# Patient Record
Sex: Male | Born: 1945 | Race: White | Hispanic: No | Marital: Married | Smoking: Never smoker
Health system: Southern US, Community
[De-identification: ages and names within clinical notes are randomized; demographics above are authoritative.]

## PROBLEM LIST (undated history)

## (undated) DIAGNOSIS — E785 Hyperlipidemia, unspecified: Secondary | ICD-10-CM

## (undated) DIAGNOSIS — I10 Essential (primary) hypertension: Secondary | ICD-10-CM

## (undated) DIAGNOSIS — N181 Chronic kidney disease, stage 1: Secondary | ICD-10-CM

## (undated) DIAGNOSIS — M1990 Unspecified osteoarthritis, unspecified site: Secondary | ICD-10-CM

## (undated) DIAGNOSIS — E119 Type 2 diabetes mellitus without complications: Secondary | ICD-10-CM

---

## 2019-03-18 ENCOUNTER — Other Ambulatory Visit: Payer: Self-pay | Admitting: Nurse Practitioner

## 2019-03-18 DIAGNOSIS — U071 COVID-19: Secondary | ICD-10-CM

## 2019-03-18 DIAGNOSIS — E119 Type 2 diabetes mellitus without complications: Secondary | ICD-10-CM

## 2019-03-18 NOTE — Progress Notes (Signed)
  I connected by phone with Marc Young on 03/18/2019 at 11:31 AM to discuss the potential use of an new treatment for mild to moderate COVID-19 viral infection in non-hospitalized patients.  This patient is a 73 y.o. male that meets the FDA criteria for Emergency Use Authorization of bamlanivimab or casirivimab\imdevimab.  Has a (+) direct SARS-CoV-2 viral test result  Has mild or moderate COVID-19   Is ? 73 years of age and weighs ? 40 kg  Is NOT hospitalized due to COVID-19  Is NOT requiring oxygen therapy or requiring an increase in baseline oxygen flow rate due to COVID-19  Is within 10 days of symptom onset  Has at least one of the high risk factor(s) for progression to severe COVID-19 and/or hospitalization as defined in EUA.  Specific high risk criteria : Diabetes Patient is over the age of 17 and is currently being managed for diabetes.   I have spoken and communicated the following to the patient or parent/caregiver:  1. FDA has authorized the emergency use of bamlanivimab and casirivimab\imdevimab for the treatment of mild to moderate COVID-19 in adults and pediatric patients with positive results of direct SARS-CoV-2 viral testing who are 74 years of age and older weighing at least 40 kg, and who are at high risk for progressing to severe COVID-19 and/or hospitalization.  2. The significant known and potential risks and benefits of bamlanivimab and casirivimab\imdevimab, and the extent to which such potential risks and benefits are unknown.  3. Information on available alternative treatments and the risks and benefits of those alternatives, including clinical trials.  4. Patients treated with bamlanivimab and casirivimab\imdevimab should continue to self-isolate and use infection control measures (e.g., wear mask, isolate, social distance, avoid sharing personal items, clean and disinfect "high touch" surfaces, and frequent handwashing) according to CDC guidelines.    5. The patient or parent/caregiver has the option to accept or refuse bamlanivimab or casirivimab\imdevimab .  After reviewing this information with the patient, The patient agreed to proceed with receiving the bamlanimivab infusion and will be provided a copy of the Fact sheet prior to receiving the infusion.[name] 03/18/2019 11:31 AM  Note: patient will need a copy of recent positive COVID test scanned into chart before infusion.

## 2019-03-20 ENCOUNTER — Inpatient Hospital Stay (HOSPITAL_COMMUNITY)
Admission: EM | Admit: 2019-03-20 | Discharge: 2019-04-22 | DRG: 207 | Disposition: E | Payer: Medicare Other | Source: Ambulatory Visit | Attending: Pulmonary Disease | Admitting: Pulmonary Disease

## 2019-03-20 ENCOUNTER — Ambulatory Visit (HOSPITAL_COMMUNITY)
Admission: RE | Admit: 2019-03-20 | Discharge: 2019-03-20 | Disposition: A | Discharge status: Home / Self Care | Payer: Medicare Other | Source: Ambulatory Visit | Attending: Pulmonary Disease | Admitting: Pulmonary Disease

## 2019-03-20 ENCOUNTER — Other Ambulatory Visit: Payer: Self-pay

## 2019-03-20 ENCOUNTER — Emergency Department (HOSPITAL_COMMUNITY): Payer: Medicare Other

## 2019-03-20 ENCOUNTER — Encounter (HOSPITAL_COMMUNITY): Payer: Self-pay | Admitting: Internal Medicine

## 2019-03-20 DIAGNOSIS — G92 Toxic encephalopathy: Secondary | ICD-10-CM | POA: Diagnosis present

## 2019-03-20 DIAGNOSIS — Z515 Encounter for palliative care: Secondary | ICD-10-CM | POA: Diagnosis not present

## 2019-03-20 DIAGNOSIS — I959 Hypotension, unspecified: Secondary | ICD-10-CM | POA: Diagnosis not present

## 2019-03-20 DIAGNOSIS — E871 Hypo-osmolality and hyponatremia: Secondary | ICD-10-CM | POA: Diagnosis present

## 2019-03-20 DIAGNOSIS — D62 Acute posthemorrhagic anemia: Secondary | ICD-10-CM | POA: Diagnosis not present

## 2019-03-20 DIAGNOSIS — Z7984 Long term (current) use of oral hypoglycemic drugs: Secondary | ICD-10-CM

## 2019-03-20 DIAGNOSIS — A4189 Other specified sepsis: Secondary | ICD-10-CM | POA: Diagnosis not present

## 2019-03-20 DIAGNOSIS — Z7189 Other specified counseling: Secondary | ICD-10-CM | POA: Diagnosis not present

## 2019-03-20 DIAGNOSIS — E119 Type 2 diabetes mellitus without complications: Secondary | ICD-10-CM | POA: Insufficient documentation

## 2019-03-20 DIAGNOSIS — J9601 Acute respiratory failure with hypoxia: Secondary | ICD-10-CM

## 2019-03-20 DIAGNOSIS — J9690 Respiratory failure, unspecified, unspecified whether with hypoxia or hypercapnia: Secondary | ICD-10-CM

## 2019-03-20 DIAGNOSIS — U071 COVID-19: Principal | ICD-10-CM | POA: Diagnosis present

## 2019-03-20 DIAGNOSIS — K922 Gastrointestinal hemorrhage, unspecified: Secondary | ICD-10-CM | POA: Diagnosis not present

## 2019-03-20 DIAGNOSIS — R579 Shock, unspecified: Secondary | ICD-10-CM | POA: Diagnosis not present

## 2019-03-20 DIAGNOSIS — J9600 Acute respiratory failure, unspecified whether with hypoxia or hypercapnia: Secondary | ICD-10-CM

## 2019-03-20 DIAGNOSIS — Z4659 Encounter for fitting and adjustment of other gastrointestinal appliance and device: Secondary | ICD-10-CM

## 2019-03-20 DIAGNOSIS — R000 Tachycardia, unspecified: Secondary | ICD-10-CM | POA: Diagnosis not present

## 2019-03-20 DIAGNOSIS — Y95 Nosocomial condition: Secondary | ICD-10-CM | POA: Diagnosis not present

## 2019-03-20 DIAGNOSIS — A419 Sepsis, unspecified organism: Secondary | ICD-10-CM | POA: Diagnosis not present

## 2019-03-20 DIAGNOSIS — T40605A Adverse effect of unspecified narcotics, initial encounter: Secondary | ICD-10-CM | POA: Diagnosis not present

## 2019-03-20 DIAGNOSIS — Z66 Do not resuscitate: Secondary | ICD-10-CM | POA: Diagnosis present

## 2019-03-20 DIAGNOSIS — N179 Acute kidney failure, unspecified: Secondary | ICD-10-CM | POA: Diagnosis not present

## 2019-03-20 DIAGNOSIS — E870 Hyperosmolality and hypernatremia: Secondary | ICD-10-CM | POA: Diagnosis not present

## 2019-03-20 DIAGNOSIS — J154 Pneumonia due to other streptococci: Secondary | ICD-10-CM | POA: Diagnosis not present

## 2019-03-20 DIAGNOSIS — N181 Chronic kidney disease, stage 1: Secondary | ICD-10-CM | POA: Diagnosis present

## 2019-03-20 DIAGNOSIS — Z7982 Long term (current) use of aspirin: Secondary | ICD-10-CM

## 2019-03-20 DIAGNOSIS — R6889 Other general symptoms and signs: Secondary | ICD-10-CM

## 2019-03-20 DIAGNOSIS — L8990 Pressure ulcer of unspecified site, unspecified stage: Secondary | ICD-10-CM | POA: Insufficient documentation

## 2019-03-20 DIAGNOSIS — E1165 Type 2 diabetes mellitus with hyperglycemia: Secondary | ICD-10-CM | POA: Diagnosis present

## 2019-03-20 DIAGNOSIS — E785 Hyperlipidemia, unspecified: Secondary | ICD-10-CM | POA: Diagnosis present

## 2019-03-20 DIAGNOSIS — E876 Hypokalemia: Secondary | ICD-10-CM | POA: Diagnosis present

## 2019-03-20 DIAGNOSIS — E872 Acidosis: Secondary | ICD-10-CM

## 2019-03-20 DIAGNOSIS — K567 Ileus, unspecified: Secondary | ICD-10-CM

## 2019-03-20 DIAGNOSIS — R6521 Severe sepsis with septic shock: Secondary | ICD-10-CM | POA: Diagnosis not present

## 2019-03-20 DIAGNOSIS — J80 Acute respiratory distress syndrome: Secondary | ICD-10-CM | POA: Diagnosis not present

## 2019-03-20 DIAGNOSIS — J9811 Atelectasis: Secondary | ICD-10-CM | POA: Diagnosis not present

## 2019-03-20 DIAGNOSIS — Z79899 Other long term (current) drug therapy: Secondary | ICD-10-CM

## 2019-03-20 DIAGNOSIS — Z888 Allergy status to other drugs, medicaments and biological substances status: Secondary | ICD-10-CM

## 2019-03-20 DIAGNOSIS — J9691 Respiratory failure, unspecified with hypoxia: Secondary | ICD-10-CM

## 2019-03-20 DIAGNOSIS — G931 Anoxic brain damage, not elsewhere classified: Secondary | ICD-10-CM | POA: Diagnosis present

## 2019-03-20 DIAGNOSIS — R001 Bradycardia, unspecified: Secondary | ICD-10-CM | POA: Diagnosis not present

## 2019-03-20 DIAGNOSIS — R0902 Hypoxemia: Secondary | ICD-10-CM

## 2019-03-20 DIAGNOSIS — E8770 Fluid overload, unspecified: Secondary | ICD-10-CM | POA: Diagnosis not present

## 2019-03-20 DIAGNOSIS — E1122 Type 2 diabetes mellitus with diabetic chronic kidney disease: Secondary | ICD-10-CM | POA: Diagnosis present

## 2019-03-20 DIAGNOSIS — Z9289 Personal history of other medical treatment: Secondary | ICD-10-CM

## 2019-03-20 DIAGNOSIS — R7989 Other specified abnormal findings of blood chemistry: Secondary | ICD-10-CM | POA: Diagnosis not present

## 2019-03-20 DIAGNOSIS — G9340 Encephalopathy, unspecified: Secondary | ICD-10-CM | POA: Diagnosis not present

## 2019-03-20 DIAGNOSIS — Z7951 Long term (current) use of inhaled steroids: Secondary | ICD-10-CM

## 2019-03-20 DIAGNOSIS — J1282 Pneumonia due to coronavirus disease 2019: Secondary | ICD-10-CM | POA: Diagnosis present

## 2019-03-20 DIAGNOSIS — K5903 Drug induced constipation: Secondary | ICD-10-CM | POA: Diagnosis not present

## 2019-03-20 DIAGNOSIS — J189 Pneumonia, unspecified organism: Secondary | ICD-10-CM

## 2019-03-20 DIAGNOSIS — B952 Enterococcus as the cause of diseases classified elsewhere: Secondary | ICD-10-CM | POA: Diagnosis not present

## 2019-03-20 DIAGNOSIS — R140 Abdominal distension (gaseous): Secondary | ICD-10-CM

## 2019-03-20 DIAGNOSIS — K254 Chronic or unspecified gastric ulcer with hemorrhage: Secondary | ICD-10-CM | POA: Diagnosis present

## 2019-03-20 DIAGNOSIS — I1310 Hypertensive heart and chronic kidney disease without heart failure, with stage 1 through stage 4 chronic kidney disease, or unspecified chronic kidney disease: Secondary | ICD-10-CM | POA: Diagnosis present

## 2019-03-20 DIAGNOSIS — J9611 Chronic respiratory failure with hypoxia: Secondary | ICD-10-CM | POA: Diagnosis not present

## 2019-03-20 HISTORY — DX: Type 2 diabetes mellitus without complications: E11.9

## 2019-03-20 HISTORY — DX: Unspecified osteoarthritis, unspecified site: M19.90

## 2019-03-20 HISTORY — DX: Essential (primary) hypertension: I10

## 2019-03-20 HISTORY — DX: Hyperlipidemia, unspecified: E78.5

## 2019-03-20 HISTORY — DX: Chronic kidney disease, stage 1: N18.1

## 2019-03-20 LAB — LACTIC ACID, PLASMA
Lactic Acid, Venous: 2 mmol/L (ref 0.5–1.9)
Lactic Acid, Venous: 1.4 mmol/L (ref 0.5–1.9)

## 2019-03-20 LAB — CBC WITH DIFFERENTIAL/PLATELET
Abs Immature Granulocytes: 0.1 10*3/uL — ABNORMAL HIGH (ref 0.00–0.07)
Basophils Absolute: 0 10*3/uL (ref 0.0–0.1)
Basophils Relative: 0 %
Eosinophils Absolute: 0 10*3/uL (ref 0.0–0.5)
Eosinophils Relative: 0 %
HCT: 40.4 % (ref 39.0–52.0)
Hemoglobin: 13.2 g/dL (ref 13.0–17.0)
Immature Granulocytes: 1 %
Lymphocytes Relative: 4 %
Lymphs Abs: 0.6 10*3/uL — ABNORMAL LOW (ref 0.7–4.0)
MCH: 30.3 pg (ref 26.0–34.0)
MCHC: 32.7 g/dL (ref 30.0–36.0)
MCV: 92.7 fL (ref 80.0–100.0)
Monocytes Absolute: 0.6 10*3/uL (ref 0.1–1.0)
Monocytes Relative: 4 %
Neutro Abs: 13.2 10*3/uL — ABNORMAL HIGH (ref 1.7–7.7)
Neutrophils Relative %: 91 %
Platelets: 195 10*3/uL (ref 150–400)
RBC: 4.36 MIL/uL (ref 4.22–5.81)
RDW: 12.9 % (ref 11.5–15.5)
WBC: 14.5 10*3/uL — ABNORMAL HIGH (ref 4.0–10.5)
nRBC: 0 % (ref 0.0–0.2)

## 2019-03-20 LAB — COMPREHENSIVE METABOLIC PANEL
ALT: 37 U/L (ref 0–44)
AST: 32 U/L (ref 15–41)
Albumin: 3.3 g/dL — ABNORMAL LOW (ref 3.5–5.0)
Alkaline Phosphatase: 82 U/L (ref 38–126)
Anion gap: 17 — ABNORMAL HIGH (ref 5–15)
BUN: 38 mg/dL — ABNORMAL HIGH (ref 8–23)
CO2: 17 mmol/L — ABNORMAL LOW (ref 22–32)
Calcium: 8.8 mg/dL — ABNORMAL LOW (ref 8.9–10.3)
Chloride: 95 mmol/L — ABNORMAL LOW (ref 98–111)
Creatinine, Ser: 1.37 mg/dL — ABNORMAL HIGH (ref 0.61–1.24)
GFR calc Af Amer: 59 mL/min — ABNORMAL LOW (ref >60)
GFR calc non Af Amer: 51 mL/min — ABNORMAL LOW (ref >60)
Glucose, Bld: 482 mg/dL — ABNORMAL HIGH (ref 70–99)
Potassium: 3.6 mmol/L (ref 3.5–5.1)
Sodium: 129 mmol/L — ABNORMAL LOW (ref 135–145)
Total Bilirubin: 1 mg/dL (ref 0.3–1.2)
Total Protein: 6.5 g/dL (ref 6.5–8.1)

## 2019-03-20 LAB — CBG MONITORING, ED
Glucose-Capillary: 368 mg/dL — ABNORMAL HIGH (ref 70–99)
Glucose-Capillary: 266 mg/dL — ABNORMAL HIGH (ref 70–99)

## 2019-03-20 LAB — PROCALCITONIN: Procalcitonin: 0.68 ng/mL

## 2019-03-20 LAB — LACTATE DEHYDROGENASE: LDH: 320 U/L — ABNORMAL HIGH (ref 98–192)

## 2019-03-20 LAB — POC SARS CORONAVIRUS 2 AG -  ED: SARS Coronavirus 2 Ag: POSITIVE — AB

## 2019-03-20 LAB — FERRITIN: Ferritin: 658 ng/mL — ABNORMAL HIGH (ref 24–336)

## 2019-03-20 LAB — HEMOGLOBIN A1C
Hgb A1c MFr Bld: 9.8 % — ABNORMAL HIGH (ref 4.8–5.6)
Mean Plasma Glucose: 234.56 mg/dL

## 2019-03-20 LAB — C-REACTIVE PROTEIN: CRP: 28.3 mg/dL — ABNORMAL HIGH (ref <1.0)

## 2019-03-20 LAB — TRIGLYCERIDES: Triglycerides: 186 mg/dL — ABNORMAL HIGH (ref <150)

## 2019-03-20 LAB — D-DIMER, QUANTITATIVE: D-Dimer, Quant: 1.61 ug/mL-FEU — ABNORMAL HIGH (ref 0.00–0.50)

## 2019-03-20 LAB — FIBRINOGEN: Fibrinogen: >800 mg/dL — ABNORMAL HIGH (ref 210–475)

## 2019-03-20 LAB — SARS CORONAVIRUS 2 (TAT 6-24 HRS): SARS Coronavirus 2: POSITIVE — AB

## 2019-03-20 MED ORDER — ZINC SULFATE 220 (50 ZN) MG PO CAPS
220.0000 mg | ORAL_CAPSULE | Freq: Every day | ORAL | Status: DC
  Administered 2019-03-20 – 2019-03-22 (×3): 220 mg via ORAL
  Filled 2019-03-20 (×3): qty 1

## 2019-03-20 MED ORDER — TOCILIZUMAB 400 MG/20ML IV SOLN
540.0000 mg | Freq: Once | INTRAVENOUS | Status: AC
  Administered 2019-03-20: 20:00:00 540 mg via INTRAVENOUS
  Filled 2019-03-20: qty 27

## 2019-03-20 MED ORDER — BUDESONIDE 180 MCG/ACT IN AEPB
2.0000 | INHALATION_SPRAY | Freq: Two times a day (BID) | RESPIRATORY_TRACT | Status: DC
  Administered 2019-03-21 (×2): 2 via RESPIRATORY_TRACT
  Filled 2019-03-20: qty 1

## 2019-03-20 MED ORDER — DIPHENHYDRAMINE HCL 50 MG/ML IJ SOLN: 50.0000 mg | Freq: Once | INTRAMUSCULAR | Status: DC | PRN

## 2019-03-20 MED ORDER — SODIUM CHLORIDE 0.9 % IV SOLN
200.0000 mg | Freq: Once | INTRAVENOUS | Status: AC
  Administered 2019-03-20: 15:00:00 200 mg via INTRAVENOUS
  Filled 2019-03-20: qty 200

## 2019-03-20 MED ORDER — INSULIN ASPART 100 UNIT/ML ~~LOC~~ SOLN
0.0000 [IU] | SUBCUTANEOUS | Status: DC
  Administered 2019-03-20: 15 [IU] via SUBCUTANEOUS
  Administered 2019-03-20: 20:00:00 8 [IU] via SUBCUTANEOUS
  Administered 2019-03-21: 15 [IU] via SUBCUTANEOUS
  Administered 2019-03-21: 04:00:00 3 [IU] via SUBCUTANEOUS
  Administered 2019-03-21: 8 [IU] via SUBCUTANEOUS
  Filled 2019-03-20: qty 0.15

## 2019-03-20 MED ORDER — DOXAZOSIN MESYLATE 1 MG PO TABS
1.0000 mg | ORAL_TABLET | Freq: Every day | ORAL | Status: DC
  Administered 2019-03-20 – 2019-03-21 (×2): 1 mg via ORAL
  Filled 2019-03-20 (×3): qty 1

## 2019-03-20 MED ORDER — ASPIRIN EC 81 MG PO TBEC
81.0000 mg | DELAYED_RELEASE_TABLET | Freq: Every day | ORAL | Status: DC
  Administered 2019-03-20 – 2019-03-22 (×3): 81 mg via ORAL
  Filled 2019-03-20 (×3): qty 1

## 2019-03-20 MED ORDER — DEXAMETHASONE SODIUM PHOSPHATE 10 MG/ML IJ SOLN
6.0000 mg | INTRAMUSCULAR | Status: DC
  Administered 2019-03-20: 6 mg via INTRAVENOUS
  Filled 2019-03-20: qty 1

## 2019-03-20 MED ORDER — IPRATROPIUM-ALBUTEROL 20-100 MCG/ACT IN AERS
1.0000 | INHALATION_SPRAY | Freq: Four times a day (QID) | RESPIRATORY_TRACT | Status: DC
  Administered 2019-03-20 – 2019-03-21 (×5): 1 via RESPIRATORY_TRACT
  Filled 2019-03-20: qty 4

## 2019-03-20 MED ORDER — ACETAMINOPHEN 325 MG PO TABS
650.0000 mg | ORAL_TABLET | Freq: Four times a day (QID) | ORAL | Status: DC | PRN
  Administered 2019-03-20: 20:00:00 650 mg via ORAL
  Filled 2019-03-20: qty 2

## 2019-03-20 MED ORDER — METHYLPREDNISOLONE SODIUM SUCC 125 MG IJ SOLR: 125.0000 mg | Freq: Once | INTRAMUSCULAR | Status: DC | PRN

## 2019-03-20 MED ORDER — SODIUM CHLORIDE 0.9 % IV SOLN: INTRAVENOUS | Status: DC | PRN

## 2019-03-20 MED ORDER — SODIUM CHLORIDE 0.9 % IV SOLN
200.0000 mg | Freq: Once | INTRAVENOUS | Status: AC
  Administered 2019-03-20: 19:00:00 200 mg via INTRAVENOUS
  Filled 2019-03-20: qty 40

## 2019-03-20 MED ORDER — ASCORBIC ACID 500 MG PO TABS
500.0000 mg | ORAL_TABLET | Freq: Every day | ORAL | Status: DC
  Administered 2019-03-20 – 2019-03-22 (×3): 500 mg via ORAL
  Filled 2019-03-20 (×3): qty 1

## 2019-03-20 MED ORDER — SODIUM CHLORIDE 0.9 % IV SOLN: 100.0000 mg | Freq: Every day | INTRAVENOUS | Status: DC

## 2019-03-20 MED ORDER — FAMOTIDINE IN NACL 20-0.9 MG/50ML-% IV SOLN: 20.0000 mg | Freq: Once | INTRAVENOUS | Status: DC | PRN

## 2019-03-20 MED ORDER — EPINEPHRINE 0.3 MG/0.3ML IJ SOAJ: 0.3000 mg | Freq: Once | INTRAMUSCULAR | Status: DC | PRN

## 2019-03-20 MED ORDER — CARVEDILOL 25 MG PO TABS
25.0000 mg | ORAL_TABLET | Freq: Two times a day (BID) | ORAL | Status: DC
  Administered 2019-03-20 – 2019-03-21 (×3): 25 mg via ORAL
  Filled 2019-03-20: qty 2
  Filled 2019-03-20 (×2): qty 1
  Filled 2019-03-20: qty 2

## 2019-03-20 MED ORDER — SODIUM CHLORIDE 0.9 % IV SOLN: INTRAVENOUS | Status: AC

## 2019-03-20 MED ORDER — SODIUM CHLORIDE 0.9 % IV SOLN
700.0000 mg | Freq: Once | INTRAVENOUS | Status: DC
  Filled 2019-03-20: qty 20

## 2019-03-20 MED ORDER — PRAVASTATIN SODIUM 80 MG PO TABS
80.0000 mg | ORAL_TABLET | Freq: Every day | ORAL | Status: DC
  Administered 2019-03-20: 80 mg via ORAL
  Filled 2019-03-20: qty 4
  Filled 2019-03-20: qty 2
  Filled 2019-03-20: qty 1

## 2019-03-20 MED ORDER — SODIUM CHLORIDE 0.9 % IV SOLN
100.0000 mg | Freq: Every day | INTRAVENOUS | Status: AC
  Administered 2019-03-21 – 2019-03-24 (×4): 100 mg via INTRAVENOUS
  Filled 2019-03-20 (×4): qty 20

## 2019-03-20 MED ORDER — ENOXAPARIN SODIUM 40 MG/0.4ML ~~LOC~~ SOLN: 40.0000 mg | SUBCUTANEOUS | Status: DC

## 2019-03-20 MED ORDER — TOCILIZUMAB 400 MG/20ML IV SOLN
530.0000 mg | Freq: Once | INTRAVENOUS | Status: DC
  Filled 2019-03-20: qty 26.5

## 2019-03-20 MED ORDER — GUAIFENESIN-DM 100-10 MG/5ML PO SYRP
10.0000 mL | ORAL_SOLUTION | ORAL | Status: DC | PRN
  Administered 2019-03-20 – 2019-03-21 (×3): 10 mL via ORAL
  Filled 2019-03-20 (×4): qty 10

## 2019-03-20 MED ORDER — AMLODIPINE BESYLATE 5 MG PO TABS
10.0000 mg | ORAL_TABLET | Freq: Every day | ORAL | Status: DC
  Administered 2019-03-20 – 2019-03-21 (×2): 10 mg via ORAL
  Filled 2019-03-20: qty 2
  Filled 2019-03-20: qty 1

## 2019-03-20 MED ORDER — SENNOSIDES-DOCUSATE SODIUM 8.6-50 MG PO TABS: 1.0000 | ORAL_TABLET | Freq: Every evening | ORAL | Status: DC | PRN

## 2019-03-20 MED ORDER — RAMELTEON 8 MG PO TABS
4.0000 mg | ORAL_TABLET | Freq: Every day | ORAL | Status: DC
  Administered 2019-03-20 – 2019-03-21 (×2): 4 mg via ORAL
  Filled 2019-03-20 (×2): qty 1

## 2019-03-20 MED ORDER — ALBUTEROL SULFATE HFA 108 (90 BASE) MCG/ACT IN AERS: 2.0000 | INHALATION_SPRAY | Freq: Once | RESPIRATORY_TRACT | Status: DC | PRN

## 2019-03-20 MED ORDER — INSULIN DETEMIR 100 UNIT/ML ~~LOC~~ SOLN
0.1500 [IU]/kg | Freq: Two times a day (BID) | SUBCUTANEOUS | Status: DC
  Administered 2019-03-20 – 2019-03-23 (×6): 10 [IU] via SUBCUTANEOUS
  Filled 2019-03-20 (×9): qty 0.1

## 2019-03-20 NOTE — ED Notes (Signed)
Called April, RN at [facility] to let her know that [facility] was here to transport patient and updated her that patient was now on 14L HFNC.

## 2019-03-20 NOTE — ED Notes (Signed)
Messaged provider regarding insulin administration. At 15:51 CBG 368. Administered 15 novolog. Provider said to give levemir 10 units tonight for coverage through the night.

## 2019-03-20 NOTE — ED Notes (Signed)
Attempted to call report. Person who answered said Antoinette not available and to call her back at [phone].

## 2019-03-20 NOTE — ED Notes (Signed)
Date and time results received: 03/06/2019 12:01 PM (use smartphrase ".now" to insert current time)  Test: Lactic Critical Value: 2.0  Name of Provider Notified: Domenic Moras

## 2019-03-20 NOTE — H&P (Addendum)
History and Physical        Hospital Admission Note Date: 03/17/2019  Patient name: Marc Young Medical record number: [id number] Date of birth: 1945-07-06 Age: 73 y.o. Gender: male  PCP: Everlean Cherry, MD  Patient coming from: Home Lives with: Wife At baseline, ambulates: Independently  Chief Complaint    Shortness of breath   HPI:   Is a 73 year old male with history of hypertension, diabetes on Metformin and Trulicity, recently tested positive for COVID-19 who presented to the ED with shortness of breath and new oxygen requirement.  His wife's coworker recently tested positive roughly 1 week ago and shortly after patient and wife developed nonproductive cough prompting urgent care visit.  Covid test was initially negative however after after repeat testing he was positive for COVID-19.  He was scheduled to have monoclonal IV infusion today but upon his arrival he was found to be hypoxic with SPO2 at 83% on room air and sent to ED before he received infusion.  Has had subjective fevers, body aches, change in taste and smell with recurrent nonproductive cough.  Denied any nausea, vomiting, rash, lower extremity swelling.  Review of Systems: See HPI above Review of Systems  Constitutional: Positive for fever.       Night sweats  HENT: Negative for sore throat.   Eyes: Negative for blurred vision.  Respiratory: Positive for cough and shortness of breath. Negative for sputum production.   Cardiovascular: Negative for chest pain and palpitations.  Gastrointestinal: Negative for nausea and vomiting.  Genitourinary: Negative for dysuria.  Musculoskeletal: Negative for joint pain.  Skin: Negative for rash.  Neurological: Negative for dizziness and headaches.  Psychiatric/Behavioral: The patient has insomnia.        Has been awakening at night with night sweats  All other  systems reviewed and are negative.   ED Workup/Course   COVID-19 positive  Notable Labs: NA 129, CL 95, CO2 17, glucose 482, BUN/creatinine 38/1.37, anion gap 17, CRP 28, lactic acid 2.0-> 1.4, procalcitonin 0.68, WBC 14.5, D-dimer 1.61, fibrinogen > 800  Imaging: CXR extensive multifocal pneumonia more severe on right than left, borderline cardiomegaly  Medical/Social/Family History   Past Medical History: Past Medical History:  Diagnosis Date  . Arthritis   . Chronic kidney disease (CKD) stage G1/A1, glomerular filtration rate (GFR) equal to or greater than 90 mL/min/1.73 square meter and albuminuria creatinine ratio less than 30 mg/g   . Diabetes mellitus without complication (HCC)   . Hyperlipidemia   . Hypertension     History reviewed. No pertinent surgical history.  Medications: Prior to Admission medications   Medication Sig Start Date End Date Taking? Authorizing Provider  amLODipine (NORVASC) 10 MG tablet Take 10 mg by mouth daily. 02/16/19  Yes [provider]  aspirin 81 MG EC tablet Take 81 mg by mouth daily.   Yes [provider]  benzonatate (TESSALON) 200 MG capsule Take 200 mg by mouth every 8 (eight) hours as needed for cough.   Yes [provider]  carvedilol (COREG) 25 MG tablet Take 25 mg by mouth 2 (two) times daily. 12/31/18  Yes [provider]  doxazosin (CARDURA) 1 MG tablet Take 1 mg by mouth daily.  03/17/19  Yes [provider]  doxycycline (VIBRA-TABS) 100 MG tablet Take 100 mg by mouth 2 (two) times daily. 03/18/19  Yes [provider]  metFORMIN (GLUCOPHAGE-XR) 500 MG 24 hr tablet Take 1,000 mg by mouth 2 (two) times daily. 01/19/19  Yes [provider]  Multiple Vitamin (MULTI-VITAMIN) tablet Take 1 tablet by mouth daily.   Yes [provider]  pravastatin (PRAVACHOL) 80 MG tablet Take 80 mg by mouth at bedtime. 01/11/19  Yes [provider]  PULMICORT FLEXHALER 180  MCG/ACT inhaler Inhale 2 puffs into the lungs 2 (two) times daily. 03/18/19  Yes [provider]  TRULICITY 7.67 MC/9.4BS SOPN Inject 0.75 mg into the skin once a week. 03/13/19  Yes [provider]  valsartan (DIOVAN) 160 MG tablet Take 160 mg by mouth daily. 01/11/19  Yes [provider]    Allergies:   Allergies  Allergen Reactions  . Benazepril Cough  . Hydrochlorothiazide Other (See Comments)    SIADH hyponatremia  . Lisinopril Cough  . Losartan Potassium Cough    Social History:  reports that he has never smoked. He has never used smokeless tobacco. He reports that he does not drink alcohol or use drugs.  Family History: History reviewed. No pertinent family history.   Objective   Physical Exam: Blood pressure 139/63, pulse 81, temperature 98.3 F (36.8 C), resp. rate (!) 27, height 5\' 3"  (1.6 m), weight 65.8 kg, SpO2 91 %.  Physical Exam Vitals and nursing note reviewed.  Constitutional:      Appearance: Normal appearance.  HENT:     Head: Normocephalic and atraumatic.     Mouth/Throat:     Comments: Patient with mask on, not visualized Eyes:     Extraocular Movements: Extraocular movements intact.  Cardiovascular:     Rate and Rhythm: Normal rate.     Pulses: Normal pulses.  Pulmonary:     Effort: Pulmonary effort is normal. No respiratory distress.     Comments: On 5 L nasal cannula Abdominal:     Tenderness: There is no abdominal tenderness. There is no guarding.  Musculoskeletal:        General: No swelling or tenderness.     Cervical back: Normal range of motion. No rigidity.  Neurological:     Mental Status: He is alert. Mental status is at baseline.  Psychiatric:        Mood and Affect: Mood normal.        Behavior: Behavior normal.     LABS on Admission: I have personally reviewed all the labs and imagings below    Basic Metabolic Panel: Recent Labs  Lab 03/01/2019 1033  NA 129*  K 3.6  CL 95*  CO2 17*    GLUCOSE 482*  BUN 38*  CREATININE 1.37*  CALCIUM 8.8*   Liver Function Tests: Recent Labs  Lab 02/22/2019 1033  AST 32  ALT 37  ALKPHOS 82  BILITOT 1.0  PROT 6.5  ALBUMIN 3.3*   No results for input(s): LIPASE, AMYLASE in the last 168 hours. No results for input(s): AMMONIA in the last 168 hours. CBC: Recent Labs  Lab 03/19/2019 1033  WBC 14.5*  NEUTROABS 13.2*  HGB 13.2  HCT 40.4  MCV 92.7  PLT 195   Cardiac Enzymes: No results for input(s): CKTOTAL, CKMB, CKMBINDEX, TROPONINI in the last 168 hours. BNP: Invalid input(s): POCBNP CBG: No results for input(s): GLUCAP in the last 168 hours.  Radiological Exams on Admission:  DG Chest  Port 1 View  Result Date: 03/14/2019 CLINICAL DATA:  Shortness of breath.  COVID-19 positive EXAM: PORTABLE CHEST 1 VIEW COMPARISON:  None. FINDINGS: There is extensive airspace opacity throughout the right mid lung and both basilar regions. Heart is borderline enlarged with pulmonary vascularity normal. No adenopathy evident. No bone lesions. IMPRESSION: Extensive multifocal pneumonia, more severe on the right than on the left. Borderline cardiomegaly. No adenopathy demonstrable. Electronically Signed   By: Bretta Bang III M.D.   On: 03/10/2019 11:28      EKG: Independently reviewed.  Normal axis, normal sinus rhythm no pathologic ST changes   A & P   Active Problems:   COVID-19   1. Acute hypoxic respiratory failure secondary to COVID-19 a. LDH 320, CRP 28.3, D-dimer 1.61, fibrinogen > 800, WBC 14.5 b. CXR extensive multifocal pneumonia more severe on right than left c. Remdesivir per pharmacy consult d. Dexamethasone 6 mg IV daily with COVID-19 insulin order set e. Actemra x1 f. Combivent g. Zinc and vitamin C h. Trend inflammatory markers i. Transfer to [facility] j. will hold off on antibiotics right now given afebrile and nontoxic-appearing 2. Hyponatremia a. NA 129, Cl 95, suspect decreased p.o. intake b. IV  fluids 3. Type 2 diabetes with hyperglycemia a. On Metformin and Trulicity at home, holding home meds b. No A1c on file - obtain HA1C c. Levemir 10 units twice daily, moderate sliding scale per COVID-19 steroid order set 4. Hypertension a. Home meds: Amlodipine 10 mg daily, Coreg 25 mg grams twice daily, valsartan 1 to 60 mg daily, doxazosin 1 mg daily b. Hold valsartan in setting of elevated creatinine, continue remaining home meds 5. Elevated creatinine, unknown baseline a. Creatinine 1.37, suspect decreased p.o. intake b. IV fluids 6. Anion gap metabolic acidosis likely lactic acidosis a. Lactic acid 2.0-> 1.4 b. Unlikely DKA c. Continue IV fluids and monitor 7. Hyperlipidemia a. Continue pravastatin 80 mg daily b. On aspirin   DVT prophylaxis: Lovenox   Code Status: Full Diet: Heart healthy/carb modified Family Communication: Admission, patients condition and plan of care including tests being ordered have been discussed with the patient who indicates understanding and agrees with the plan and Code Status. Patient's wife was updated by phone Disposition Plan: The appropriate patient status for this patient is INPATIENT. Inpatient status is judged to be reasonable and necessary in order to provide the required intensity of service to ensure the patient's safety. The patient's presenting symptoms, physical exam findings, and initial radiographic and laboratory data in the context of their chronic comorbidities is felt to place them at high risk for further clinical deterioration. Furthermore, it is not anticipated that the patient will be medically stable for discharge from the hospital within 2 midnights of admission. The following factors support the patient status of inpatient.   " The patient's presenting symptoms include shortness of breath. " The worrisome physical exam findings include requiring 5 L nasal cannula. " The initial radiographic and laboratory data are worrisome  because of chest x-ray with multifocal pneumonia, COVID-19 positive, elevated creatinine. " The chronic co-morbidities include hypertension, hyperlipidemia.   * I certify that at the point of admission it is my clinical judgment that the patient will require inpatient hospital care spanning beyond 2 midnights from the point of admission due to high intensity of service, high risk for further deterioration and high frequency of surveillance required.*    The medical decision making on this patient was of high complexity and the patient is at  high risk for clinical deterioration, therefore this is a level 3  admission.  Consultants  . None  Procedures  . None  Time Spent on Admission: 75 minutes   Jae DireJared E Bruna Dills, DO [facility] 03/16/2019, 3:17 PM

## 2019-03-20 NOTE — ED Triage Notes (Signed)
[facility] transported pt from [facility] to [facility] ED and reports the following:  Pt is COVID positive. Symptoms (coughing) began on 03/12/19. On 03/18/19 pt tested positive. Did not receive infusion at clinic. He became winded when he walked from his car to infusion clinic. At clinic his sat was in low 80s. They applied 3L and went to low 90s. Recently, he has  had body aches and temp. At 7am took 600mg  ibuprofen. His wife is COVID positive. 22 g iv in right forearm.

## 2019-03-20 NOTE — Progress Notes (Signed)
Upon arrival to the infusion clinic patient oxygen saturation remained between 82% to 85% on room air and respiration was 24. Patient was placed on 3L of oxygen which helped O2 level increased to 93%. Patient was unable to receive the infusion due to oxygen saturation. RN placed  22g on the left forearm. [facility] was called to transport patient to emergency room.

## 2019-03-20 NOTE — ED Notes (Signed)
Called [facility] for pt transport. They estimate arriving at [facility] ED after 7PM.

## 2019-03-20 NOTE — ED Notes (Signed)
Pt stood to use urinal and his O2 sat dropped to 80% while standing.

## 2019-03-20 NOTE — ED Provider Notes (Signed)
[facility]-EMERGENCY DEPT Provider Note   CSN: [id number] Arrival date & time: 02/28/2019  6213     History No chief complaint on file.   Marc Young is a 73 y.o. male.  The history is provided by the patient and medical records. No language interpreter was used.       73 year old male with history of hypertension, diabetes, recently tested positive for COVID-19 presented to ED for evaluation of shortness of breath and new oxygen requirement.  Patient report 8 days ago patient's wife coworker tested positive for COVID-19.  Patient and wife shortly after developed nonproductive cough prompting urgent care visit.  Initially his Covid test was negative however after second visit to the doctor, patient was test positive for COVID-19.  Scheduled to have monoclonal IV infusion today but upon his visit patient was found to be hypoxic with O2 sats at 83% on room air thus prompting this ER visit.  Patient did report having subjective fever, mild body aches, decreased taste and smell as well as having recurrent cough.  He does not complain of any significant shortness of breath, no chest pain no nausea vomiting diarrhea abdominal pain or rash.  No history of COPD or asthma.  Denies tobacco abuse.   No past medical history on file.  There are no problems to display for this patient.   The histories are not reviewed yet. Please review them in the "History" navigator section and refresh this SmartLink.     No family history on file.  Social History   Tobacco Use  . Smoking status: Not on file  Substance Use Topics  . Alcohol use: Not on file  . Drug use: Not on file    Home Medications Prior to Admission medications   Not on File    Allergies    Patient has no allergy information on record.  Review of Systems   Review of Systems  All other systems reviewed and are negative.   Physical Exam Updated Vital Signs BP (!) 120/58   Pulse 80   Temp 98.3 F  (36.8 C)   Resp (!) 24   SpO2 92%   Physical Exam Vitals and nursing note reviewed.  Constitutional:      General: He is not in acute distress.    Appearance: He is well-developed.  HENT:     Head: Atraumatic.  Eyes:     Conjunctiva/sclera: Conjunctivae normal.  Cardiovascular:     Rate and Rhythm: Normal rate and regular rhythm.     Pulses: Normal pulses.     Heart sounds: Normal heart sounds.  Pulmonary:     Breath sounds: Rales (faint crackles heard at the lung base.) present.  Abdominal:     Tenderness: There is no abdominal tenderness.  Musculoskeletal:        General: No swelling.     Cervical back: Neck supple.  Skin:    Findings: No rash.  Neurological:     Mental Status: He is alert and oriented to person, place, and time.  Psychiatric:        Mood and Affect: Mood normal.     ED Results / Procedures / Treatments   Labs (all labs ordered are listed, but only abnormal results are displayed) Labs Reviewed  LACTIC ACID, PLASMA - Abnormal; Notable for the following components:      Result Value   Lactic Acid, Venous 2.0 (*)    All other components within normal limits  CBC WITH DIFFERENTIAL/PLATELET -  Abnormal; Notable for the following components:   WBC 14.5 (*)    Neutro Abs 13.2 (*)    Lymphs Abs 0.6 (*)    Abs Immature Granulocytes 0.10 (*)    All other components within normal limits  COMPREHENSIVE METABOLIC PANEL - Abnormal; Notable for the following components:   Sodium 129 (*)    Chloride 95 (*)    CO2 17 (*)    Glucose, Bld 482 (*)    BUN 38 (*)    Creatinine, Ser 1.37 (*)    Calcium 8.8 (*)    Albumin 3.3 (*)    GFR calc non Af Amer 51 (*)    GFR calc Af Amer 59 (*)    Anion gap 17 (*)    All other components within normal limits  D-DIMER, QUANTITATIVE (NOT AT [facility]) - Abnormal; Notable for the following components:   D-Dimer, Quant 1.61 (*)    All other components within normal limits  LACTATE DEHYDROGENASE - Abnormal; Notable for the  following components:   LDH 320 (*)    All other components within normal limits  FERRITIN - Abnormal; Notable for the following components:   Ferritin 658 (*)    All other components within normal limits  TRIGLYCERIDES - Abnormal; Notable for the following components:   Triglycerides 186 (*)    All other components within normal limits  FIBRINOGEN - Abnormal; Notable for the following components:   Fibrinogen >800 (*)    All other components within normal limits  C-REACTIVE PROTEIN - Abnormal; Notable for the following components:   CRP 28.3 (*)    All other components within normal limits  POC SARS CORONAVIRUS 2 AG -  ED - Abnormal; Notable for the following components:   SARS Coronavirus 2 Ag POSITIVE (*)    All other components within normal limits  CULTURE, BLOOD (ROUTINE X 2)  CULTURE, BLOOD (ROUTINE X 2)  SARS CORONAVIRUS 2 (TAT 6-24 HRS)  PROCALCITONIN  LACTIC ACID, PLASMA    EKG None  Radiology DG Chest Port 1 View  Result Date: 02/23/2019 CLINICAL DATA:  Shortness of breath.  COVID-19 positive EXAM: PORTABLE CHEST 1 VIEW COMPARISON:  None. FINDINGS: There is extensive airspace opacity throughout the right mid lung and both basilar regions. Heart is borderline enlarged with pulmonary vascularity normal. No adenopathy evident. No bone lesions. IMPRESSION: Extensive multifocal pneumonia, more severe on the right than on the left. Borderline cardiomegaly. No adenopathy demonstrable. Electronically Signed   By: Bretta BangWilliam  Woodruff III M.D.   On: 03/03/2019 11:28    Procedures .Critical Care Performed by: Fayrene Helperran, Stephenia Vogan, PA-C Authorized by: Fayrene Helperran, Doni Widmer, PA-C   Critical care provider statement:    Critical care time (minutes):  45   Critical care was time spent personally by me on the following activities:  Discussions with consultants, evaluation of patient's response to treatment, examination of patient, ordering and performing treatments and interventions, ordering and  review of laboratory studies, ordering and review of radiographic studies, pulse oximetry, re-evaluation of patient's condition, obtaining history from patient or surrogate and review of old charts   (including critical care time)  Medications Ordered in ED Medications - No data to display  ED Course  I have reviewed the triage vital signs and the nursing notes.  Pertinent labs & imaging results that were available during my care of the patient were reviewed by me and considered in my medical decision making (see chart for details).    MDM Rules/Calculators/A&P  BP (!) 120/58   Pulse 80   Temp 98.3 F (36.8 C)   Resp (!) 24   SpO2 92%   Final Clinical Impression(s) / ED Diagnoses Final diagnoses:  Acute hypoxemic respiratory failure due to COVID-19 [facility])    Rx / DC Orders ED Discharge Orders    None     10:22 AM This is a 73 year old male recently test positive for COVID-19 at an outside facility here with shortness of breath, O2 sats in the low 80s on room air improved to 95 on 3 L of O2.  He is otherwise well-appearing in no acute discomfort.  Risk factors include hypertension and diabetes.  Given new oxygen requirement, patient would likely benefit from hospital admission.  Work-up initiated.  10:27 AM Patient's O2 sats at 71% on room air after he got up to get a piece of paper.  He became very short of breath.  Will place on 3 L of O2.  12:48 PM Patient is symptomatic with acute respiratory discomfort, O2 sats at 92% on 5 L of O2.  However, I do not think BiPAP intubation is indicated at this moment.  He is still able to speak in complete sentence.  He agrees to be admitted for respiratory distress secondary to COVID-19 infection.  Appreciate consultation from [facility], Dr. Neysa Bonito, who agrees to see and admit patient to [facility] for further management.  Kairo Laubacher was evaluated in Emergency Department on 03/05/2019 for the  symptoms described in the history of present illness. He was evaluated in the context of the global COVID-19 pandemic, which necessitated consideration that the patient might be at risk for infection with the SARS-CoV-2 virus that causes COVID-19. Institutional protocols and algorithms that pertain to the evaluation of patients at risk for COVID-19 are in a state of rapid change based on information released by regulatory bodies including the CDC and federal and state organizations. These policies and algorithms were followed during the patient's care in the ED.    Domenic Moras, PA-C 03/13/2019 1251    Tegeler, Gwenyth Allegra, MD 03/17/2019 [phone]

## 2019-03-20 NOTE — ED Notes (Signed)
Upon entering the room, pt o2 sat 71. He said he was breathing so hard bc he got up to get a piece of paper out of hte chair beside him.

## 2019-03-21 ENCOUNTER — Other Ambulatory Visit: Payer: Self-pay

## 2019-03-21 ENCOUNTER — Inpatient Hospital Stay (HOSPITAL_COMMUNITY): Payer: Medicare Other

## 2019-03-21 LAB — URINALYSIS, ROUTINE W REFLEX MICROSCOPIC
Bacteria, UA: NONE SEEN
Bilirubin Urine: NEGATIVE
Glucose, UA: >=500 mg/dL — AB
Hgb urine dipstick: NEGATIVE
Ketones, ur: NEGATIVE mg/dL
Leukocytes,Ua: NEGATIVE
Nitrite: NEGATIVE
Protein, ur: 30 mg/dL — AB
Specific Gravity, Urine: 1.018 (ref 1.005–1.030)
pH: 5 (ref 5.0–8.0)

## 2019-03-21 LAB — ABO/RH: ABO/RH(D): O POS

## 2019-03-21 LAB — CBC WITH DIFFERENTIAL/PLATELET
Abs Immature Granulocytes: 0.08 10*3/uL — ABNORMAL HIGH (ref 0.00–0.07)
Basophils Absolute: 0 10*3/uL (ref 0.0–0.1)
Basophils Relative: 0 %
Eosinophils Absolute: 0 10*3/uL (ref 0.0–0.5)
Eosinophils Relative: 0 %
HCT: 42.1 % (ref 39.0–52.0)
Hemoglobin: 13.9 g/dL (ref 13.0–17.0)
Immature Granulocytes: 1 %
Lymphocytes Relative: 4 %
Lymphs Abs: 0.4 10*3/uL — ABNORMAL LOW (ref 0.7–4.0)
MCH: 29.8 pg (ref 26.0–34.0)
MCHC: 33 g/dL (ref 30.0–36.0)
MCV: 90.3 fL (ref 80.0–100.0)
Monocytes Absolute: 0.4 10*3/uL (ref 0.1–1.0)
Monocytes Relative: 3 %
Neutro Abs: 11.5 10*3/uL — ABNORMAL HIGH (ref 1.7–7.7)
Neutrophils Relative %: 92 %
Platelets: 240 10*3/uL (ref 150–400)
RBC: 4.66 MIL/uL (ref 4.22–5.81)
RDW: 13.2 % (ref 11.5–15.5)
WBC: 12.3 10*3/uL — ABNORMAL HIGH (ref 4.0–10.5)
nRBC: 0 % (ref 0.0–0.2)

## 2019-03-21 LAB — COMPREHENSIVE METABOLIC PANEL
ALT: 38 U/L (ref 0–44)
AST: 43 U/L — ABNORMAL HIGH (ref 15–41)
Albumin: 3.2 g/dL — ABNORMAL LOW (ref 3.5–5.0)
Alkaline Phosphatase: 86 U/L (ref 38–126)
Anion gap: 12 (ref 5–15)
BUN: 46 mg/dL — ABNORMAL HIGH (ref 8–23)
CO2: 23 mmol/L (ref 22–32)
Calcium: 8.6 mg/dL — ABNORMAL LOW (ref 8.9–10.3)
Chloride: 101 mmol/L (ref 98–111)
Creatinine, Ser: 1.17 mg/dL (ref 0.61–1.24)
GFR calc Af Amer: >60 mL/min (ref >60)
GFR calc non Af Amer: >60 mL/min (ref >60)
Glucose, Bld: 94 mg/dL (ref 70–99)
Potassium: 3.2 mmol/L — ABNORMAL LOW (ref 3.5–5.1)
Sodium: 136 mmol/L (ref 135–145)
Total Bilirubin: 0.6 mg/dL (ref 0.3–1.2)
Total Protein: 6.7 g/dL (ref 6.5–8.1)

## 2019-03-21 LAB — D-DIMER, QUANTITATIVE: D-Dimer, Quant: 2.27 ug/mL-FEU — ABNORMAL HIGH (ref 0.00–0.50)

## 2019-03-21 LAB — C DIFFICILE QUICK SCREEN W PCR REFLEX
C Diff antigen: NEGATIVE
C Diff interpretation: NOT DETECTED
C Diff toxin: NEGATIVE

## 2019-03-21 LAB — FERRITIN: Ferritin: 961 ng/mL — ABNORMAL HIGH (ref 24–336)

## 2019-03-21 LAB — GLUCOSE, CAPILLARY
Glucose-Capillary: 176 mg/dL — ABNORMAL HIGH (ref 70–99)
Glucose-Capillary: 292 mg/dL — ABNORMAL HIGH (ref 70–99)
Glucose-Capillary: 400 mg/dL — ABNORMAL HIGH (ref 70–99)
Glucose-Capillary: 63 mg/dL — ABNORMAL LOW (ref 70–99)

## 2019-03-21 LAB — PROCALCITONIN: Procalcitonin: 11.51 ng/mL

## 2019-03-21 LAB — MAGNESIUM: Magnesium: 2.1 mg/dL (ref 1.7–2.4)

## 2019-03-21 LAB — C-REACTIVE PROTEIN: CRP: 30.6 mg/dL — ABNORMAL HIGH (ref <1.0)

## 2019-03-21 MED ORDER — INSULIN ASPART 100 UNIT/ML ~~LOC~~ SOLN
0.0000 [IU] | Freq: Three times a day (TID) | SUBCUTANEOUS | Status: DC
  Administered 2019-03-21: 8 [IU] via SUBCUTANEOUS
  Administered 2019-03-22 (×2): 5 [IU] via SUBCUTANEOUS
  Administered 2019-03-22: 16:00:00 3 [IU] via SUBCUTANEOUS

## 2019-03-21 MED ORDER — DEXAMETHASONE SODIUM PHOSPHATE 10 MG/ML IJ SOLN
10.0000 mg | INTRAMUSCULAR | Status: DC
  Administered 2019-03-21 – 2019-03-23 (×3): 10 mg via INTRAVENOUS
  Filled 2019-03-21 (×4): qty 1

## 2019-03-21 MED ORDER — SODIUM CHLORIDE 0.9 % IV SOLN
2.0000 g | INTRAVENOUS | Status: DC
  Administered 2019-03-21 – 2019-03-26 (×6): 2 g via INTRAVENOUS
  Filled 2019-03-21 (×7): qty 20

## 2019-03-21 MED ORDER — SODIUM CHLORIDE 0.9% IV SOLUTION: Freq: Once | INTRAVENOUS | Status: AC

## 2019-03-21 MED ORDER — SODIUM CHLORIDE 0.9 % IV SOLN
500.0000 mg | INTRAVENOUS | Status: AC
  Administered 2019-03-21 – 2019-03-27 (×7): 500 mg via INTRAVENOUS
  Filled 2019-03-21 (×7): qty 500

## 2019-03-21 MED ORDER — ENOXAPARIN SODIUM 40 MG/0.4ML ~~LOC~~ SOLN
40.0000 mg | Freq: Every day | SUBCUTANEOUS | Status: DC
  Administered 2019-03-21 – 2019-03-22 (×2): 40 mg via SUBCUTANEOUS
  Filled 2019-03-21 (×2): qty 0.4

## 2019-03-21 MED ORDER — POTASSIUM CHLORIDE CRYS ER 20 MEQ PO TBCR
40.0000 meq | EXTENDED_RELEASE_TABLET | Freq: Once | ORAL | Status: AC
  Administered 2019-03-21: 15:00:00 40 meq via ORAL
  Filled 2019-03-21: qty 2

## 2019-03-21 MED ORDER — INSULIN ASPART 100 UNIT/ML ~~LOC~~ SOLN
4.0000 [IU] | Freq: Three times a day (TID) | SUBCUTANEOUS | Status: DC
  Administered 2019-03-21 – 2019-03-22 (×2): 4 [IU] via SUBCUTANEOUS

## 2019-03-21 NOTE — Progress Notes (Signed)
Pt A&Ox4 cont on O2@15LHFNC  and 15L non rebreather mask Sats when resting 88-91%, on exertion will deSat in the 40s 50s, pt does recover fairly well after resting, unable to prone d/t rounded belly, laying on side at present sats 88%, no resp distress noted,loose stools x2, voiding, denies pain or discomfort pleasant and co-op will cont to monitor.

## 2019-03-21 NOTE — Progress Notes (Signed)
Spoke with spouse while in patient's room.

## 2019-03-21 NOTE — Progress Notes (Signed)
PROGRESS NOTE                                                                                                                                                                                                             Patient Demographics:    Marc Young, is a 73 y.o. male, DOB - 24-Apr-1945, NMM:[id number]  Admit date - 03/05/2019   Admitting Physician Jae Dire, MD  Outpatient Primary MD for the patient is Everlean Cherry, MD  LOS - 1   No chief complaint on file.      Brief Narrative   Is a 73 year old male with history of hypertension, diabetes on Metformin and Trulicity, recently tested positive for COVID-19 who presented to the ED with shortness of breath and new oxygen requirement.  His wife's coworker recently tested positive roughly 1 week ago and shortly after patient and wife developed nonproductive cough prompting urgent care visit.  Covid test was initially negative however after after repeat testing he was positive for COVID-19.  He was scheduled to have monoclonal IV infusion today but upon his arrival he was found to be hypoxic with SPO2 at 83% on room air and sent to ED before he received infusion.  Has had subjective fevers, body aches, change in taste and smell with recurrent nonproductive cough.  Denied any nausea, vomiting, rash, lower extremity swelling.   Subjective:    [name] today reports some dyspnea, generalized weakness, cough, denies any chest pain .   Assessment  & Plan :    Active Problems:   COVID-19  Acute hypoxic respiratory failure secondary to COVID-19 - appears to be with significant hypoxic respiratory failure, with significant oxygen requirement, he is on 15 L high flow nasal cannula and NRB combined. -He did receive Actemra x1, -Cussed convulsant plasma with the patient, he is agreeable, will proceed with transfusion. -Continue with Decadron, I have increased his dose to 10 mg IV daily given  his profound hypoxic respiratory failure. -Continue with IV remdesivir. -Was encouraged use incentive spirometry, flutter valve, and to prone as tolerated. -Continue to trend inflammatory markers closely, especially with elevated CRP at 30.6, and D-dimers trending up. -Procalcitonin significantly trending up 0.6> 11, start on azithromycin and Rocephin to cover for CAP as well. -Patient is critically ill, with significant oxygen requirement, he is at high risk  for intubation   Recent Labs    02/19/2019 1033 03/21/19 0555  DDIMER 1.61* 2.27*  FERRITIN 658* 961*  LDH 320*  --   CRP 28.3* 30.6*    Lab Results  Component Value Date   SARSCOV2NAA POSITIVE (A) 02/24/2019   Elevated procalcitonin -Indicative of bacterial infection, given the fact significantly trending up is concerning, continue with bacterial coverage for CAP, check UA, will obtain blood cultures and check for C. Difficile(given he had some diarrhea), and will obtain UA.  Will obtain KUB given he had some benign abdominal wall distention.  Type 2 diabetes with hyperglycemia -CBG seems to be poorly controlled, will continue with Levemir 10 units twice daily, continue sliding scale, will add units before meals as well.  Hypertension -Home meds: Amlodipine 10 mg daily, Coreg 25 mg grams twice daily, valsartan 1 to 60 mg daily, doxazosin 1 mg daily. - Hold valsartan in setting of elevated creatinine, continue remaining home meds  Elevated creatinine, unknown baseline -Hold valsartan  Hypokalemia -Repleted  Anion gap metabolic acidosis likely lactic acidosis -Resolved  Hyperlipidemia -Continue with home dose statin.   [place]    02/26/2019 1033 03/21/19 0555  DDIMER 1.61* 2.27*  FERRITIN 658* 961*  LDH 320*  --   CRP 28.3* 30.6*    Lab Results  Component Value Date   SARSCOV2NAA POSITIVE (A) 03/02/2019     Code Status : Full  Family Communication  : updated wife via  phone  Disposition Plan  : remains inpatient  Consults  :  none  Procedures  : none  DVT Prophylaxis  :  [place] lovenox  Lab Results  Component Value Date   PLT 240 03/21/2019    Antibiotics  :    Anti-infectives (From admission, onward)   Start     Dose/Rate Route Frequency Ordered Stop   03/21/19 1000  remdesivir 100 mg in sodium chloride 0.9 % 100 mL IVPB     100 mg 200 mL/hr over 30 Minutes Intravenous Daily 02/26/2019 1637 Apr 04, 2019 0959   03/21/19 1000  remdesivir 100 mg in sodium chloride 0.9 % 100 mL IVPB  Status:  Discontinued     100 mg 200 mL/hr over 30 Minutes Intravenous Daily 02/23/2019 1429 03/19/2019 1807   03/21/19 0800  cefTRIAXone (ROCEPHIN) 2 g in sodium chloride 0.9 % 100 mL IVPB     2 g 200 mL/hr over 30 Minutes Intravenous Every 24 hours 03/21/19 0714     03/21/19 0800  azithromycin (ZITHROMAX) 500 mg in sodium chloride 0.9 % 250 mL IVPB     500 mg 250 mL/hr over 60 Minutes Intravenous Every 24 hours 03/21/19 0714     02/28/2019 1830  remdesivir 200 mg in sodium chloride 0.9% 250 mL IVPB     200 mg 580 mL/hr over 30 Minutes Intravenous Once 03/15/2019 1637 03/02/2019 2257   03/16/2019 1600  remdesivir 200 mg in sodium chloride 0.9% 250 mL IVPB     200 mg 580 mL/hr over 30 Minutes Intravenous Once 02/27/2019 1429 02/21/2019 1547        Objective:   Vitals:   03/21/19 0600 03/21/19 0725 03/21/19 1126 03/21/19 1315  BP:  124/62 122/62   Pulse: 78 61 80   Resp: (!) 34 (!) 24 20   Temp:  98.5 F (36.9 C) 97.8 F (36.6 C)   TempSrc:  Oral Oral   SpO2: (!) 88% 91% (!) 87% 94%  Weight:      Height:  Wt Readings from Last 3 Encounters:  03/10/2019 65.8 kg     Intake/Output Summary (Last 24 hours) at 03/21/2019 1352 Last data filed at 03/21/2019 0836 Gross per 24 hour  Intake 950 ml  Output 1300 ml  Net -350 ml     Physical Exam  Awake Alert, Oriented X 3, No new F.N deficits, Normal affect Symmetrical Chest wall movement, Good air movement  bilaterally, scattered rales RRR,No Gallops,Rubs or new Murmurs, No Parasternal Heave +ve B.Sounds, abdomen is mildly distended, but benign, nontender, no rebound, no guarding no Cyanosis, Clubbing or edema, No new Rash or bruise      Data Review:    CBC Recent Labs  Lab 03/19/2019 1033 03/21/19 0555  WBC 14.5* 12.3*  HGB 13.2 13.9  HCT 40.4 42.1  PLT 195 240  MCV 92.7 90.3  MCH 30.3 29.8  MCHC 32.7 33.0  RDW 12.9 13.2  LYMPHSABS 0.6* 0.4*  MONOABS 0.6 0.4  EOSABS 0.0 0.0  BASOSABS 0.0 0.0    Chemistries  Recent Labs  Lab 03/21/2019 1033 03/21/19 0555  NA 129* 136  K 3.6 3.2*  CL 95* 101  CO2 17* 23  GLUCOSE 482* 94  BUN 38* 46*  CREATININE 1.37* 1.17  CALCIUM 8.8* 8.6*  MG  --  2.1  AST 32 43*  ALT 37 38  ALKPHOS 82 86  BILITOT 1.0 0.6   ------------------------------------------------------------------------------------------------------------------ Recent Labs    02/23/2019 1033  TRIG 186*    Lab Results  Component Value Date   HGBA1C 9.8 (H) 03/11/2019   ------------------------------------------------------------------------------------------------------------------ No results for input(s): TSH, T4TOTAL, T3FREE, THYROIDAB in the last 72 hours.  Invalid input(s): FREET3 ------------------------------------------------------------------------------------------------------------------ Recent Labs    03/19/2019 1033 03/21/19 0555  FERRITIN 658* 961*    Coagulation profile No results for input(s): INR, PROTIME in the last 168 hours.  Recent Labs    03/13/2019 1033 03/21/19 0555  DDIMER 1.61* 2.27*    Cardiac Enzymes No results for input(s): CKMB, TROPONINI, MYOGLOBIN in the last 168 hours.  Invalid input(s): CK ------------------------------------------------------------------------------------------------------------------ No results found for: BNP  Inpatient Medications  Scheduled Meds: . sodium chloride   Intravenous Once  .  amLODipine  10 mg Oral Daily  . vitamin C  500 mg Oral Daily  . aspirin EC  81 mg Oral Daily  . budesonide  2 puff Inhalation BID  . carvedilol  25 mg Oral BID  . dexamethasone (DECADRON) injection  10 mg Intravenous Q24H  . doxazosin  1 mg Oral Daily  . enoxaparin (LOVENOX) injection  40 mg Subcutaneous Daily  . insulin aspart  0-15 Units Subcutaneous Q4H  . insulin detemir  0.15 Units/kg Subcutaneous BID  . Ipratropium-Albuterol  1 puff Inhalation Q6H  . pravastatin  80 mg Oral QHS  . ramelteon  4 mg Oral QHS  . zinc sulfate  220 mg Oral Daily   Continuous Infusions: . azithromycin 500 mg (03/21/19 0745)  . cefTRIAXone (ROCEPHIN)  IV 2 g (03/21/19 0734)  . remdesivir 100 mg in NS 100 mL 100 mg (03/21/19 0946)   PRN Meds:.acetaminophen, guaiFENesin-dextromethorphan, senna-docusate  Micro Results Recent Results (from the past 240 hour(s))  Blood Culture (routine x 2)     Status: None (Preliminary result)   Collection Time: 03/21/2019 10:25 AM   Specimen: BLOOD  Result Value Ref Range Status   Specimen Description   Final    BLOOD RIGHT ANTECUBITAL Performed at [facility], [address] [place], [place] [address]  Special Requests   Final    BOTTLES DRAWN AEROBIC ONLY Blood Culture adequate volume   Culture   Final    NO GROWTH < 24 HOURS Performed at [facility], [address] [place], [place] [address]    Report Status PENDING  Incomplete  Blood Culture (routine x 2)     Status: None (Preliminary result)   Collection Time: Apr 04, 2018 10:37 AM   Specimen: BLOOD  Result Value Ref Range Status   Specimen Description   Final    BLOOD BLOOD LEFT FOREARM Performed at [facility], [address] [place], [place] [address]    Special Requests   Final    BOTTLES DRAWN AEROBIC AND ANAEROBIC Blood Culture adequate volume   Culture   Final    NO GROWTH < 24 HOURS Performed at [facility], [address]  [place], [place] [address]    Report Status PENDING  Incomplete  SARS CORONAVIRUS 2 (TAT 6-24 HRS) Nasopharyngeal Nasopharyngeal Swab     Status: Abnormal   Collection Time: Apr 04, 2018 10:57 AM   Specimen: Nasopharyngeal Swab  Result Value Ref Range Status   SARS Coronavirus 2 POSITIVE (A) NEGATIVE Final    Comment: RESULT CALLED TO, READ BACK BY AND VERIFIED WITH: P.DOWD RN [id number] Jan 15, 202020 MCCORMICK K (NOTE) SARS-CoV-2 target nucleic acids are DETECTED. The SARS-CoV-2 RNA is generally detectable in upper and lower respiratory specimens during the acute phase of infection. Positive results are indicative of the presence of SARS-CoV-2 RNA. Clinical correlation with patient history and other diagnostic information is  necessary to determine patient infection status. Positive results do not rule out bacterial infection or co-infection with other viruses.  The expected result is Negative. Fact Sheet for Patients: HairSlick.nohttps://www.fda.gov/media/138098/download Fact Sheet for Healthcare Providers: quierodirigir.comhttps://www.fda.gov/media/138095/download This test is not yet approved or cleared by the [place] FDA and  has been authorized for detection and/or diagnosis of SARS-CoV-2 by FDA under an Emergency Use Authorization (EUA). This EUA will remain  in effect (meaning this test can be used) for t he duration of the COVID-19 declaration under Section 564(b)(1) of the Act, 21 U.S.C. section 360bbb-3(b)(1), unless the authorization is terminated or revoked sooner. Performed at [facility], [address] [place], [place] [address]     Radiology Reports DG Chest [place] View  Result Date: Jan 15, 202020 CLINICAL DATA:  Shortness of breath.  COVID-19 positive EXAM: PORTABLE CHEST 1 VIEW COMPARISON:  None. FINDINGS: There is extensive airspace opacity throughout the right mid lung and both basilar regions. Heart is borderline enlarged with pulmonary vascularity normal. No adenopathy evident. No bone  lesions. IMPRESSION: Extensive multifocal pneumonia, more severe on the right than on the left. Borderline cardiomegaly. No adenopathy demonstrable. Electronically Signed   By: Bretta BangWilliam  Woodruff III M.D.   On: Jan 15, 202020 11:28     Huey Bienenstockawood Yen Wandell M.D on 03/21/2019 at 1:52 PM  Between 7am to 7pm - Pager - [phone]  After 7pm go to [email or web address] - password [email or web address]  [facility] -  Office  [phone]

## 2019-03-21 NOTE — Progress Notes (Signed)
Rapid Response Event Note  Overview: Requested to assess patient by Dr. Waldron Labs due to increase in oxygen needs.    Initial Focused Assessment: Patient was alert and oriented, very cheerful. He was non-labored, with diminished lung sounds, and was easily maintaining oxygen saturations at 88% on 15L [place]/15L NRB after activity.   Interventions: None at this time. Will continue to monitor.  Plan of Care (if not transferred): If the bedside team has any other questions or concerns please call our team [phone] and we will be happy to support you.         Elspeth Cho

## 2019-03-21 NOTE — Plan of Care (Signed)
  Problem: Education: Goal: Knowledge of risk factors and measures for prevention of condition will improve 03/21/2019 0610 by Delcie Roch, Derreon Consalvo, RN Outcome: Progressing 03/21/2019 0105 by Izell [place], RN Outcome: Progressing   Problem: Coping: Goal: Psychosocial and spiritual needs will be supported 03/21/2019 0610 by Delcie Roch, Hero Kulish, RN Outcome: Progressing 03/21/2019 0105 by Izell [place], RN Outcome: Progressing   Problem: Respiratory: Goal: Will maintain a patent airway 03/21/2019 0610 by Delcie Roch, Mirabella Hilario, RN Outcome: Progressing 03/21/2019 0105 by Izell Binford, RN Outcome: Progressing Goal: Complications related to the disease process, condition or treatment will be avoided or minimized 03/21/2019 0610 by Izell [place], RN Outcome: Progressing 03/21/2019 0105 by Izell Ambler, RN Outcome: Progressing

## 2019-03-21 NOTE — Progress Notes (Signed)
Pt arrived on unit via transport on 15L NRB. Pt now on 15L HFNC sats 91-92%. Skin intact. No skin issues to note at this time. Pt has no complaints at this time. Pt called wife to tell her new location. No questions at this time. Other RR 35-40's, other VSS. Pt oriented to unit. MD notified via amion that pt has arrived on unit, also notified him of pt VS.

## 2019-03-21 NOTE — Progress Notes (Signed)
Pt on NRB W/5LNC sats 83%. CN made aware. Request for RT to see pt.

## 2019-03-21 NOTE — Progress Notes (Signed)
[facility] night shift ICU transfer note.  Brief Narrative   [name] is a 73 year old male with history of hypertension, diabetes on Metformin and Trulicity, recently tested positive for COVID-19 who presented to the ED with shortness of breath and new oxygen requirement. His wife's coworker recently tested positive roughly 1 week ago and shortly after patient and wife developed nonproductive cough prompting urgent care visit. Covid test was initially negative however after after repeat testing he was positive for COVID-19. He was scheduled to have monoclonal IV infusion today but upon his arrival he was found to be hypoxic with SPO2 at 83% on room air and sent to ED before he received infusion. Has had subjective fevers, body aches, change in taste and smell with recurrent nonproductive cough. Denied any nausea, vomiting, rash, lower extremity swelling on admission.   On 03/21/2019 the patient O2 saturation began to decreased to the low 80's then to low 70's and high 60s despite maximum non-invasive oxygen therapy measures. He was tachypneic in the 30s and sometimes in the 40s. Unable to speak in full sentences. He had accessory muscle use. His breath sounds were significantly decreased, particularly on the right lung fields with very poor inspiratory effort. Heart S1S2, RRR. Abdomen was soft, NT. No edema on extremities, but had mild distal cyanosis. His ABG showed a pH of 7.33, PCO2 of 45.5 and PO2 33.0 on combined 100% oxygen HFNC and NRB. The patient agreed with mechanical ventilation. I also spoke to the patient's wife and told her that we were proceeding with ET intubation. Anesthesia contacted and patient underwent intubation by our CRNA on call, Ms. Doreene BurkeBeth Marshall. Discussed the case with Dr. Arsenio LoaderSommer who is on call for PCCM eLink tonight.    Component Value Units  Triglycerides [[id number]][[id number]]   Collected: 03/22/19 0115   Updated: 03/22/19 0322    Triglycerides 91 mg/dL  Comprehensive  metabolic panel [[id number]][[id number]] (Abnormal)   Collected: 03/22/19 0115   Updated: 03/22/19 0322   Specimen Type: Blood    Sodium 142 mmol/L   Potassium 4.5 mmol/L   Chloride 108 mmol/L   CO2 24 mmol/L   Glucose, Bld 193High  mg/dL   BUN 96EXBM49High  mg/dL   Creatinine, Ser 8.411.10 mg/dL   Calcium 9.1 mg/dL   Total Protein 7.1 g/dL   Albumin 3.2GMW3.1Low  g/dL   AST 10UVOZ52High  U/L   ALT 53High  U/L   Alkaline Phosphatase 103 U/L   Total Bilirubin 0.3 mg/dL   GFR calc non Af Amer >60 mL/min   GFR calc Af Amer >60 mL/min   Anion gap 10  Magnesium [[id number]][[id number]]   Collected: 03/22/19 0115   Updated: 03/22/19 0322   Specimen Type: Blood    Magnesium 2.2 mg/dL  Prepare fresh frozen plasma [[id number]][[id number]]   Collected: 03/21/19 1148   Updated: 03/22/19 0307   Specimen Type: Blood    Unit Number [id number]   Blood Component Type FP24 PHR THW   Unit division B0   Status of Unit ISSUED,FINAL   Transfusion Status --   OK TO TRANSFUSE  Performed at [facility], [address] [place], [place] [address]   MRSA PCR Screening [[id number]][[id number]]   Collected: 03/22/19 0226   Updated: 03/22/19 0252   Specimen Type: Nasopharyngeal   Specimen Source: Nasal Mucosa   C-reactive protein [[id number]][[id number]] (Abnormal)   Collected: 03/22/19 0115   Updated: 03/22/19 0229   Specimen Type: Blood    CRP 19.7High  mg/dL  Ferritin [[id number]][[id number]] (Abnormal)  Collected: 03/22/19 0115   Updated: 03/22/19 0229   Specimen Type: Blood    Ferritin 1,189High  ng/mL  Lactic acid, plasma [[id number]]   Collected: 03/22/19 0115   Updated: 03/22/19 0207   Specimen Type: Blood    Lactic Acid, Venous 1.4 mmol/L  D-dimer, quantitative (not at [facility]) [[id number]] (Abnormal)   Collected: 03/22/19 0115   Updated: 03/22/19 0154   Specimen Type: Blood    D-Dimer, Quant 3.10High  ug/mL-FEU  CBC with Differential/Platelet [[id number]] (Abnormal)   Collected: 03/22/19 0115   Updated: 03/22/19 0141   Specimen Type: Blood    WBC  16.1High  K/uL   RBC 4.72 MIL/uL   Hemoglobin 14.3 g/dL   HCT 49.4 %   MCV 49.6 fL   MCH 30.3 pg   MCHC 33.0 g/dL   RDW 75.9 %   Platelets 273 K/uL   nRBC 0.0 %   Neutrophils Relative % 92 %   Neutro Abs 14.7High  K/uL   Lymphocytes Relative 4 %   Lymphs Abs 0.6Low  K/uL   Monocytes Relative 3 %   Monocytes Absolute 0.5 K/uL   Eosinophils Relative 0 %   Eosinophils Absolute 0.0 K/uL   Basophils Relative 0 %   Basophils Absolute 0.0 K/uL   Immature Granulocytes 1 %   Abs Immature Granulocytes 0.19High  K/uL  I-STAT 7, (LYTES, BLD GAS, ICA, H+H) [[id number]] (Abnormal)   Collected: 03/22/19 0009   Updated: 03/22/19 0130    pH, Arterial 7.329Low    pCO2 arterial 45.5 mmHg   pO2, Arterial 33.0Low Panic   mmHg   Bicarbonate 24.3 mmol/L   TCO2 26 mmol/L   O2 Saturation 62.0 %   Acid-base deficit 2.0 mmol/L   Sodium 142 mmol/L   Potassium 4.1 mmol/L   Calcium, Ion 1.31 mmol/L   HCT 38.0Low  %   Hemoglobin 12.9Low  g/dL   Patient temperature 93.5 C   Collection site RADIAL, ALLEN'S TEST ACCEPTABLE   Drawn by Operator   Sample type ARTERIAL   Comment NOTIFIED PHYSICIAN  Procalcitonin [[id number]]   Collected: 03/22/19 0115   Updated: 03/22/19 0123   C difficile quick scan w PCR reflex [[id number]]   Collected: 03/21/19 1352   Updated: 03/21/19 2143   Specimen Type: Stool    C Diff antigen NEGATIVE   C Diff toxin NEGATIVE   C Diff interpretation No C. difficile detected.  Urinalysis, Routine w reflex microscopic [[id number]] (Abnormal)   Collected: 03/21/19 1404   Updated: 03/21/19 2115   Specimen Type: Urine    Color, Urine YELLOW   APPearance CLEAR   Specific Gravity, Urine 1.018   pH 5.0   Glucose, UA >=500Abnormal  mg/dL   Hgb urine dipstick NEGATIVE   Bilirubin Urine NEGATIVE   Ketones, ur NEGATIVE mg/dL   Protein, ur 45GYBWLSLH  mg/dL   Nitrite NEGATIVE   Leukocytes,Ua NEGATIVE   RBC / HPF 0-5 RBC/hpf   WBC, UA 0-5 WBC/hpf   Bacteria, UA NONE SEEN   Mucus  PRESENT   Study Result  CLINICAL DATA:  Hypoxia. Intubation.  EXAM: PORTABLE CHEST 1 VIEW  COMPARISON:  Radiograph 03/12/2019  FINDINGS: Endotracheal tube tip 3.3 cm from the carina. Low lung volumes persist. Diffuse bilateral heterogeneous airspace opacities, progressed from prior exam, particularly in the left lung. Cardiomediastinal contours are grossly unchanged, partially obscured by bilateral lung opacities. Possible pleural effusions. No evidence of pneumothorax.  IMPRESSION: 1. Endotracheal tube tip 3.3 cm from the carina. 2. Progressive diffuse bilateral heterogeneous  airspace opacities over the past 2 days, particularly in the left lung. Findings consistent with pneumonia, an element of edema or ARDS also considered. 3. Possible pleural effusions.   Diagnosis:  Acute respiratory failure due to COVID-19 pneumonia/superimposed bacterial pneumonia Continue current treatment with Remdesivir, Antibacterials and glucocorticoid. Monitor CBC, CMP, inflammatory markers. Vent management and sedation per PCCM. Given age, comorbidities and significant lung involvement, his survival rate is poor.  Tried to update his wife, but calls twice went to a full voicemail.  Tennis Must, MD  About 55 minutes of critical care time were spent during the process of this emergent event.

## 2019-03-21 NOTE — Plan of Care (Signed)
  Problem: Education: Goal: Knowledge of risk factors and measures for prevention of condition will improve Outcome: Progressing   Problem: Coping: Goal: Psychosocial and spiritual needs will be supported Outcome: Progressing   Problem: Respiratory: Goal: Will maintain a patent airway Outcome: Progressing Goal: Complications related to the disease process, condition or treatment will be avoided or minimized Outcome: Progressing   

## 2019-03-21 NOTE — Progress Notes (Addendum)
Inpatient Diabetes Program Recommendations  AACE/ADA: New Consensus Statement on Inpatient Glycemic Control (2015)  Target Ranges:  Prepandial:   less than 140 mg/dL      Peak postprandial:   less than 180 mg/dL (1-2 hours)      Critically ill patients:  140 - 180 mg/dL   Results for ZENON, LEAF (MRN [id number]) as of 03/21/2019 12:45  Ref. Range 03/19/2019 15:50 03/19/2019 19:39 03/21/2019 00:04 03/21/2019 04:05 03/21/2019 07:26 03/21/2019 11:27  Glucose-Capillary Latest Ref Range: 70 - 99 mg/dL 368 (H)  15 units NOVOLOG  266 (H)  8 units NOVOLOG  292 (H)  8 units NOVOLOG +  10 units LEVEMIR given at 10:41pm  176 (H)  3 units NOVOLOG 63 (L) 400 (H)  15 units NOVOLOG +  10 units LEVEMIR given at 10:33am     Admit COVID  History: DM   Home DM Meds: Metformin XR 1000 mg BID        Trulicity 6.72 mg Qweek   Current Insulin Orders: Levemir 10 units BID       Novolog Moderate Correction Scale/ SSI (0-15 units) Q4 hours     Novolog started yest at 4pm--Levemir started last PM at bedtime--2nd dose Levemir to be given this AM.    Getting Decadron 10 mg Daily.    MD- Note patient with mild Hypoglycemia this AM (CBG 63 mg/dl).  Then had severe Hyperglycemia (CBG 400) at 12pm.  Please consider the following:  1. Decrease Levemir to 7 units BID  2. Start Novolog Meal Coverage: Novolog 4 units TID with meals  (Please add the following Hold Parameters: Hold if pt eats <50% of meal, Hold if pt NPO)     --Will follow patient during hospitalization--  Wyn Quaker RN, MSN, CDE Diabetes Coordinator Inpatient Glycemic Control Team Team Pager: [phone] (8a-5p)

## 2019-03-22 ENCOUNTER — Inpatient Hospital Stay (HOSPITAL_COMMUNITY): Payer: Medicare Other | Admitting: Registered Nurse

## 2019-03-22 ENCOUNTER — Inpatient Hospital Stay (HOSPITAL_COMMUNITY): Payer: Medicare Other

## 2019-03-22 ENCOUNTER — Inpatient Hospital Stay: Payer: Self-pay

## 2019-03-22 DIAGNOSIS — J80 Acute respiratory distress syndrome: Secondary | ICD-10-CM

## 2019-03-22 DIAGNOSIS — J9601 Acute respiratory failure with hypoxia: Secondary | ICD-10-CM

## 2019-03-22 DIAGNOSIS — U071 COVID-19: Principal | ICD-10-CM

## 2019-03-22 LAB — CBC WITH DIFFERENTIAL/PLATELET
Abs Immature Granulocytes: 0.19 10*3/uL — ABNORMAL HIGH (ref 0.00–0.07)
Basophils Absolute: 0 10*3/uL (ref 0.0–0.1)
Basophils Relative: 0 %
Eosinophils Absolute: 0 10*3/uL (ref 0.0–0.5)
Eosinophils Relative: 0 %
HCT: 43.3 % (ref 39.0–52.0)
Hemoglobin: 14.3 g/dL (ref 13.0–17.0)
Immature Granulocytes: 1 %
Lymphocytes Relative: 4 %
Lymphs Abs: 0.6 10*3/uL — ABNORMAL LOW (ref 0.7–4.0)
MCH: 30.3 pg (ref 26.0–34.0)
MCHC: 33 g/dL (ref 30.0–36.0)
MCV: 91.7 fL (ref 80.0–100.0)
Monocytes Absolute: 0.5 10*3/uL (ref 0.1–1.0)
Monocytes Relative: 3 %
Neutro Abs: 14.7 10*3/uL — ABNORMAL HIGH (ref 1.7–7.7)
Neutrophils Relative %: 92 %
Platelets: 273 10*3/uL (ref 150–400)
RBC: 4.72 MIL/uL (ref 4.22–5.81)
RDW: 13.5 % (ref 11.5–15.5)
WBC: 16.1 10*3/uL — ABNORMAL HIGH (ref 4.0–10.5)
nRBC: 0 % (ref 0.0–0.2)

## 2019-03-22 LAB — MAGNESIUM: Magnesium: 2.2 mg/dL (ref 1.7–2.4)

## 2019-03-22 LAB — GLUCOSE, CAPILLARY
Glucose-Capillary: 244 mg/dL — ABNORMAL HIGH (ref 70–99)
Glucose-Capillary: 202 mg/dL — ABNORMAL HIGH (ref 70–99)
Glucose-Capillary: 170 mg/dL — ABNORMAL HIGH (ref 70–99)
Glucose-Capillary: 137 mg/dL — ABNORMAL HIGH (ref 70–99)

## 2019-03-22 LAB — POCT I-STAT 7, (LYTES, BLD GAS, ICA,H+H)
Acid-base deficit: 2 mmol/L (ref 0.0–2.0)
Acid-base deficit: 2 mmol/L (ref 0.0–2.0)
Acid-base deficit: 3 mmol/L — ABNORMAL HIGH (ref 0.0–2.0)
Acid-base deficit: 3 mmol/L — ABNORMAL HIGH (ref 0.0–2.0)
Bicarbonate: 24.3 mmol/L (ref 20.0–28.0)
Bicarbonate: 23.9 mmol/L (ref 20.0–28.0)
Bicarbonate: 24.3 mmol/L (ref 20.0–28.0)
Bicarbonate: 23.2 mmol/L (ref 20.0–28.0)
Calcium, Ion: 1.31 mmol/L (ref 1.15–1.40)
Calcium, Ion: 1.31 mmol/L (ref 1.15–1.40)
Calcium, Ion: 1.31 mmol/L (ref 1.15–1.40)
Calcium, Ion: 1.26 mmol/L (ref 1.15–1.40)
HCT: 38 % — ABNORMAL LOW (ref 39.0–52.0)
HCT: 35 % — ABNORMAL LOW (ref 39.0–52.0)
HCT: 35 % — ABNORMAL LOW (ref 39.0–52.0)
HCT: 33 % — ABNORMAL LOW (ref 39.0–52.0)
Hemoglobin: 12.9 g/dL — ABNORMAL LOW (ref 13.0–17.0)
Hemoglobin: 11.9 g/dL — ABNORMAL LOW (ref 13.0–17.0)
Hemoglobin: 11.9 g/dL — ABNORMAL LOW (ref 13.0–17.0)
Hemoglobin: 11.2 g/dL — ABNORMAL LOW (ref 13.0–17.0)
O2 Saturation: 62 %
O2 Saturation: 75 %
O2 Saturation: 84 %
O2 Saturation: 99 %
Patient temperature: 35.8
Patient temperature: 35.8
Patient temperature: 36.6
Patient temperature: 98.2
Potassium: 4.1 mmol/L (ref 3.5–5.1)
Potassium: 4.1 mmol/L (ref 3.5–5.1)
Potassium: 4.2 mmol/L (ref 3.5–5.1)
Potassium: 3.9 mmol/L (ref 3.5–5.1)
Sodium: 142 mmol/L (ref 135–145)
Sodium: 142 mmol/L (ref 135–145)
Sodium: 143 mmol/L (ref 135–145)
Sodium: 143 mmol/L (ref 135–145)
TCO2: 26 mmol/L (ref 22–32)
TCO2: 25 mmol/L (ref 22–32)
TCO2: 26 mmol/L (ref 22–32)
TCO2: 25 mmol/L (ref 22–32)
pCO2 arterial: 45.5 mmHg (ref 32.0–48.0)
pCO2 arterial: 44.1 mmHg (ref 32.0–48.0)
pCO2 arterial: 48.2 mmHg — ABNORMAL HIGH (ref 32.0–48.0)
pCO2 arterial: 44.1 mmHg (ref 32.0–48.0)
pH, Arterial: 7.329 — ABNORMAL LOW (ref 7.350–7.450)
pH, Arterial: 7.305 — ABNORMAL LOW (ref 7.350–7.450)
pH, Arterial: 7.339 — ABNORMAL LOW (ref 7.350–7.450)
pH, Arterial: 7.328 — ABNORMAL LOW (ref 7.350–7.450)
pO2, Arterial: 33 mmHg — CL (ref 83.0–108.0)
pO2, Arterial: 42 mmHg — ABNORMAL LOW (ref 83.0–108.0)
pO2, Arterial: 51 mmHg — ABNORMAL LOW (ref 83.0–108.0)
pO2, Arterial: 154 mmHg — ABNORMAL HIGH (ref 83.0–108.0)

## 2019-03-22 LAB — FERRITIN: Ferritin: 1189 ng/mL — ABNORMAL HIGH (ref 24–336)

## 2019-03-22 LAB — BPAM FFP
Blood Product Expiration Date: 202101011610
ISSUE DATE / TIME: 202012311614
Unit Type and Rh: 5100

## 2019-03-22 LAB — COMPREHENSIVE METABOLIC PANEL
ALT: 53 U/L — ABNORMAL HIGH (ref 0–44)
AST: 52 U/L — ABNORMAL HIGH (ref 15–41)
Albumin: 3.1 g/dL — ABNORMAL LOW (ref 3.5–5.0)
Alkaline Phosphatase: 103 U/L (ref 38–126)
Anion gap: 10 (ref 5–15)
BUN: 49 mg/dL — ABNORMAL HIGH (ref 8–23)
CO2: 24 mmol/L (ref 22–32)
Calcium: 9.1 mg/dL (ref 8.9–10.3)
Chloride: 108 mmol/L (ref 98–111)
Creatinine, Ser: 1.1 mg/dL (ref 0.61–1.24)
GFR calc Af Amer: >60 mL/min (ref >60)
GFR calc non Af Amer: >60 mL/min (ref >60)
Glucose, Bld: 193 mg/dL — ABNORMAL HIGH (ref 70–99)
Potassium: 4.5 mmol/L (ref 3.5–5.1)
Sodium: 142 mmol/L (ref 135–145)
Total Bilirubin: 0.3 mg/dL (ref 0.3–1.2)
Total Protein: 7.1 g/dL (ref 6.5–8.1)

## 2019-03-22 LAB — C-REACTIVE PROTEIN: CRP: 19.7 mg/dL — ABNORMAL HIGH (ref <1.0)

## 2019-03-22 LAB — PREPARE FRESH FROZEN PLASMA

## 2019-03-22 LAB — LACTIC ACID, PLASMA: Lactic Acid, Venous: 1.4 mmol/L (ref 0.5–1.9)

## 2019-03-22 LAB — MRSA PCR SCREENING: MRSA by PCR: NEGATIVE

## 2019-03-22 LAB — PROCALCITONIN: Procalcitonin: 0.7 ng/mL

## 2019-03-22 LAB — TRIGLYCERIDES: Triglycerides: 91 mg/dL (ref <150)

## 2019-03-22 LAB — D-DIMER, QUANTITATIVE: D-Dimer, Quant: 3.1 ug/mL-FEU — ABNORMAL HIGH (ref 0.00–0.50)

## 2019-03-22 MED ORDER — CHLORHEXIDINE GLUCONATE 0.12% ORAL RINSE (MEDLINE KIT)
15.0000 mL | Freq: Two times a day (BID) | OROMUCOSAL | Status: DC
  Administered 2019-03-22 – 2019-04-10 (×38): 15 mL via OROMUCOSAL

## 2019-03-22 MED ORDER — NOREPINEPHRINE 4 MG/250ML-% IV SOLN
INTRAVENOUS | Status: AC
  Filled 2019-03-22: qty 250

## 2019-03-22 MED ORDER — PROPOFOL 10 MG/ML IV BOLUS
INTRAVENOUS | Status: AC
  Filled 2019-03-22: qty 20

## 2019-03-22 MED ORDER — ROCURONIUM BROMIDE 10 MG/ML (PF) SYRINGE
PREFILLED_SYRINGE | INTRAVENOUS | Status: AC
  Filled 2019-03-22: qty 10

## 2019-03-22 MED ORDER — ENOXAPARIN SODIUM 40 MG/0.4ML ~~LOC~~ SOLN
40.0000 mg | Freq: Two times a day (BID) | SUBCUTANEOUS | Status: DC
  Administered 2019-03-22 – 2019-03-23 (×3): 40 mg via SUBCUTANEOUS
  Filled 2019-03-22 (×3): qty 0.4

## 2019-03-22 MED ORDER — FENTANYL CITRATE (PF) 100 MCG/2ML IJ SOLN
INTRAMUSCULAR | Status: AC
  Filled 2019-03-22: qty 2

## 2019-03-22 MED ORDER — INSULIN ASPART 100 UNIT/ML ~~LOC~~ SOLN
0.0000 [IU] | Freq: Three times a day (TID) | SUBCUTANEOUS | Status: DC
  Administered 2019-03-22 – 2019-03-23 (×2): 2 [IU] via SUBCUTANEOUS
  Administered 2019-03-24: 11 [IU] via SUBCUTANEOUS

## 2019-03-22 MED ORDER — PRAVASTATIN SODIUM 40 MG PO TABS
80.0000 mg | ORAL_TABLET | Freq: Every day | ORAL | Status: DC
  Administered 2019-03-22 – 2019-03-23 (×2): 80 mg via NASOGASTRIC
  Filled 2019-03-22 (×3): qty 1

## 2019-03-22 MED ORDER — SENNOSIDES-DOCUSATE SODIUM 8.6-50 MG PO TABS
1.0000 | ORAL_TABLET | Freq: Every evening | ORAL | Status: DC | PRN
  Filled 2019-03-22: qty 1

## 2019-03-22 MED ORDER — MIDAZOLAM HCL 2 MG/2ML IJ SOLN
INTRAMUSCULAR | Status: AC
  Filled 2019-03-22: qty 4

## 2019-03-22 MED ORDER — SUCCINYLCHOLINE CHLORIDE 20 MG/ML IJ SOLN
INTRAMUSCULAR | Status: DC | PRN
  Administered 2019-03-22: 140 mg via INTRAVENOUS

## 2019-03-22 MED ORDER — VECURONIUM BROMIDE 10 MG IV SOLR
INTRAVENOUS | Status: AC
  Filled 2019-03-22: qty 10

## 2019-03-22 MED ORDER — STERILE WATER FOR INJECTION IJ SOLN
INTRAMUSCULAR | Status: AC
  Filled 2019-03-22: qty 10

## 2019-03-22 MED ORDER — SUCCINYLCHOLINE CHLORIDE 200 MG/10ML IV SOSY
PREFILLED_SYRINGE | INTRAVENOUS | Status: AC
  Filled 2019-03-22: qty 10

## 2019-03-22 MED ORDER — FAMOTIDINE IN NACL 20-0.9 MG/50ML-% IV SOLN
20.0000 mg | Freq: Two times a day (BID) | INTRAVENOUS | Status: DC
  Administered 2019-03-22 – 2019-03-23 (×4): 20 mg via INTRAVENOUS
  Filled 2019-03-22 (×4): qty 50

## 2019-03-22 MED ORDER — ETOMIDATE 2 MG/ML IV SOLN
INTRAVENOUS | Status: AC
  Filled 2019-03-22: qty 20

## 2019-03-22 MED ORDER — PROPOFOL 1000 MG/100ML IV EMUL
5.0000 ug/kg/min | INTRAVENOUS | Status: DC
  Administered 2019-03-22: 50 ug/kg/min via INTRAVENOUS
  Filled 2019-03-22: qty 100

## 2019-03-22 MED ORDER — ARTIFICIAL TEARS OPHTHALMIC OINT
1.0000 "application " | TOPICAL_OINTMENT | Freq: Three times a day (TID) | OPHTHALMIC | Status: DC
  Administered 2019-03-22 – 2019-03-26 (×12): 1 via OPHTHALMIC
  Filled 2019-03-22 (×3): qty 3.5

## 2019-03-22 MED ORDER — CISATRACURIUM BOLUS VIA INFUSION
5.0000 mg | Freq: Once | INTRAVENOUS | Status: AC
  Administered 2019-03-22: 5 mg via INTRAVENOUS
  Filled 2019-03-22: qty 5

## 2019-03-22 MED ORDER — PHENYLEPHRINE HCL-NACL 10-0.9 MG/250ML-% IV SOLN
INTRAVENOUS | Status: AC
  Filled 2019-03-22: qty 250

## 2019-03-22 MED ORDER — ZINC SULFATE 220 (50 ZN) MG PO CAPS
220.0000 mg | ORAL_CAPSULE | Freq: Every day | ORAL | Status: DC
  Administered 2019-03-23 – 2019-04-10 (×17): 220 mg
  Filled 2019-03-22 (×17): qty 1

## 2019-03-22 MED ORDER — IPRATROPIUM-ALBUTEROL 0.5-2.5 (3) MG/3ML IN SOLN: 3.0000 mL | Freq: Four times a day (QID) | RESPIRATORY_TRACT | Status: DC | PRN

## 2019-03-22 MED ORDER — FENTANYL 2500MCG IN NS 250ML (10MCG/ML) PREMIX INFUSION
25.0000 ug/h | INTRAVENOUS | Status: DC
  Administered 2019-03-22: 25 ug/h via INTRAVENOUS
  Administered 2019-03-23: 100 ug/h via INTRAVENOUS
  Administered 2019-03-24: 175 ug/h via INTRAVENOUS
  Administered 2019-03-24: 150 ug/h via INTRAVENOUS
  Administered 2019-03-25: 200 ug/h via INTRAVENOUS
  Administered 2019-03-26: 300 ug/h via INTRAVENOUS
  Filled 2019-03-22 (×7): qty 250

## 2019-03-22 MED ORDER — FENTANYL CITRATE (PF) 100 MCG/2ML IJ SOLN
25.0000 ug | INTRAMUSCULAR | Status: DC | PRN
  Administered 2019-03-22 (×2): 50 ug via INTRAVENOUS
  Filled 2019-03-22 (×2): qty 2

## 2019-03-22 MED ORDER — MIDAZOLAM 50MG/50ML (1MG/ML) PREMIX INFUSION
2.0000 mg/h | INTRAVENOUS | Status: DC
  Administered 2019-03-22: 5 mg/h via INTRAVENOUS
  Administered 2019-03-22: 2 mg/h via INTRAVENOUS
  Administered 2019-03-23 – 2019-03-24 (×3): 4 mg/h via INTRAVENOUS
  Administered 2019-03-24 (×2): 7 mg/h via INTRAVENOUS
  Administered 2019-03-25: 8 mg/h via INTRAVENOUS
  Administered 2019-03-25: 7 mg/h via INTRAVENOUS
  Administered 2019-03-25: 8 mg/h via INTRAVENOUS
  Administered 2019-03-26: 4 mg/h via INTRAVENOUS
  Filled 2019-03-22 (×11): qty 50

## 2019-03-22 MED ORDER — ASCORBIC ACID 500 MG PO TABS
500.0000 mg | ORAL_TABLET | Freq: Every day | ORAL | Status: DC
  Administered 2019-03-23 – 2019-03-28 (×6): 500 mg via NASOGASTRIC
  Filled 2019-03-22 (×5): qty 1

## 2019-03-22 MED ORDER — MIDAZOLAM BOLUS VIA INFUSION
1.0000 mg | INTRAVENOUS | Status: DC | PRN
  Administered 2019-03-25: 13:00:00 2 mg via INTRAVENOUS
  Filled 2019-03-22: qty 2

## 2019-03-22 MED ORDER — ASPIRIN 81 MG PO CHEW
81.0000 mg | CHEWABLE_TABLET | Freq: Every day | ORAL | Status: DC
  Administered 2019-03-23: 09:00:00 81 mg
  Filled 2019-03-22: qty 1

## 2019-03-22 MED ORDER — ETOMIDATE 2 MG/ML IV SOLN
INTRAVENOUS | Status: DC | PRN
  Administered 2019-03-22: 20 mg via INTRAVENOUS

## 2019-03-22 MED ORDER — CHLORHEXIDINE GLUCONATE CLOTH 2 % EX PADS
6.0000 | MEDICATED_PAD | Freq: Every day | CUTANEOUS | Status: DC
  Administered 2019-03-22 – 2019-03-25 (×5): 6 via TOPICAL

## 2019-03-22 MED ORDER — ORAL CARE MOUTH RINSE
15.0000 mL | OROMUCOSAL | Status: DC
  Administered 2019-03-22 – 2019-04-10 (×189): 15 mL via OROMUCOSAL

## 2019-03-22 MED ORDER — PROPOFOL 1000 MG/100ML IV EMUL
INTRAVENOUS | Status: AC
  Administered 2019-03-22: 01:00:00 30 ug/kg/min via INTRAVENOUS
  Filled 2019-03-22: qty 100

## 2019-03-22 MED ORDER — FENTANYL BOLUS VIA INFUSION
25.0000 ug | INTRAVENOUS | Status: DC | PRN
  Filled 2019-03-22: qty 25

## 2019-03-22 MED ORDER — FENTANYL CITRATE (PF) 100 MCG/2ML IJ SOLN: 25.0000 ug | Freq: Once | INTRAMUSCULAR | Status: DC

## 2019-03-22 MED ORDER — SODIUM CHLORIDE 0.9 % IV SOLN
0.0000 ug/kg/min | INTRAVENOUS | Status: DC
  Administered 2019-03-22: 13:00:00 3 ug/kg/min via INTRAVENOUS
  Administered 2019-03-23: 07:00:00 2.5 ug/kg/min via INTRAVENOUS
  Administered 2019-03-24: 21:00:00 1 ug/kg/min via INTRAVENOUS
  Filled 2019-03-22 (×3): qty 20

## 2019-03-22 NOTE — Progress Notes (Signed)
Spoke with patient's wife on the phone. Updated her to patient's current condition and plan of care after transfer to ICU and intubation. All questions answered.

## 2019-03-22 NOTE — Progress Notes (Signed)
Patient belongings:  Cain Saupe labeled with patient sticker and sent to security by Lily Peer.  Below items at bedside with patient:  Shoes and socks Jeans w/ belt Car keys Glasses Cellphone Miscellaneous clothes

## 2019-03-22 NOTE — Progress Notes (Signed)
Call made to pt's spouse Liborio Nixon. Updated of pt condition and plan of care. Liborio Nixon appreciates update.

## 2019-03-22 NOTE — Consult Note (Signed)
NAME:  Marc Young, MRN:  [id number], DOB:  15-Mar-1946, LOS: 2 ADMISSION DATE:  03-28-19, CONSULTATION DATE: 1/1 REFERRING MD: Dr. Randol Kern, CHIEF COMPLAINT: Acute respiratory failure  Brief History   74 year old man with diabetes and hypertension, admitted 12/30 with extensive bilateral COVID-19 pneumonia.  Required intubation 1/1.  History of present illness   74 year old man with history of diabetes and hypertension, viral symptoms beginning about 12/23.  Initial outpatient Covid testing negative but then COVID-19 positive on repeat.  Scheduled for monoclonal antibody infusion 12/30 but found to be significantly hypoxemic on room air.  Admitted 12/30 for further care.  Had been developing worsening cough, body aches, subjective fever.  Chest x-ray 12/30 with extensive multifocal bilateral infiltrates.  Empiric coverage for CAP initiated.  Corticosteroids, remdesivir started, Actemra given x1.  Experienced progressive respiratory distress following admission, ultimately required intubation mechanical ventilation early 1/1.   Past Medical History   has a past medical history of Arthritis, Chronic kidney disease (CKD) stage G1/A1, glomerular filtration rate (GFR) equal to or greater than 90 mL/min/1.73 square meter and albuminuria creatinine ratio less than 30 mg/g, Diabetes mellitus without complication (HCC), Hyperlipidemia, and Hypertension.   Significant Hospital Events   VDRF 1/1  Consults:  PCCM  Procedures:  ETT 1/1 >>   Significant Diagnostic Tests:    Micro Data:  SARS CoV2 12/30 >> positive Blood 12/30 >>  C. difficile 12/31 >> negative MRSA screen 1/1 >> negative  Antimicrobials:  Azithromycin 12/31 >>  Ceftriaxone 12/31 >>   Interim history/subjective:  Required intubation and mechanical ventilation overnight as mentioned above FiO2 0.90, PEEP 18, RR 24.  PO2 51  Objective   Blood pressure 111/61, pulse 69, temperature 98 F (36.7 C), temperature source  Axillary, resp. rate (!) 24, height 5\' 3"  (1.6 m), weight 65.8 kg, SpO2 100 %.    Vent Mode: PRVC FiO2 (%):  [90 %-100 %] 90 % Set Rate:  [18 bmp-24 bmp] 24 bmp Vt Set:  [400 mL-450 mL] 400 mL PEEP:  [16 cmH20-18 cmH20] 18 cmH20 Plateau Pressure:  [32 cmH20-35 cmH20] 35 cmH20   Intake/Output Summary (Last 24 hours) at 03/22/2019 0911 Last data filed at 03/22/2019 0900 Gross per 24 hour  Intake 1368.77 ml  Output 400 ml  Net 968.77 ml   Filed Weights   03/28/2019 1258  Weight: 65.8 kg    Examination: General: elderly man, sedated and intubated HENT: ETT in go position Lungs: coarse B, no wheeze Cardiovascular: distant, regular Abdomen: non-distended, + BS Extremities: no edema Neuro: sedatd, RASS -2 GU: condom cath  Resolved Hospital Problem list     Assessment & Plan:  Acute hypoxemic respiratory failure due to COVID-19 pneumonia Possible superimposed bacterial CAP Ventilate with low tidal volume ventilation, ventilation adequate, oxygenation poor.  Currently FiO2 0.90, PEEP 18 Goal plateau pressure less than 30, driving pressure less than 15 Wean PEEP and FiO2 as able Diurese as able, goal CVP 5-8.  Currently I/O +618 Continue dexamethasone 10 days, remdesivir 5 days Cyclical proning and paralysis as needed for ventilator synchrony Heavy sedation, RASS -4 VAP prevention order set Ceftriaxone, azithromycin started 12/31, plan 7 days, tailor to culture data.  Obtain respiratory culture today  Hypertension Hold his amlodipine, carvedilol, doxazosin if blood pressure drops in the setting of sedation  Diabetes mellitus Sliding scale insulin as ordered Levemir 10 units twice daily  Hyperlipidemia Hold Pravachol per tube  Encephalopathy, need for sedation to facilitate mechanical ventilation RASS goal -4.  Currently on propofol,  plan to transition to midazolam, fentanyl   Best practice:  Diet: N.p.o., initiate tube feeding Pain/Anxiety/Delirium protocol (if  indicated): PAD protocol, plan transition propofol to Versed VAP protocol (if indicated): As ordered DVT prophylaxis: Enoxaparin GI prophylaxis: Pepcid Glucose control: Sliding-scale insulin, Levemir Mobility: Bedrest Code Status: Full code Family Communication: attempted to call pt's wife Thayer Headings 1/1, unable to reach her. Will try again Disposition: ICU  Labs   CBC: Recent Labs  Lab 03-21-19 1033 03/21/19 0555 03/22/19 0009 03/22/19 0115 03/22/19 0152 03/22/19 0433  WBC 14.5* 12.3*  --  16.1*  --   --   NEUTROABS 13.2* 11.5*  --  14.7*  --   --   HGB 13.2 13.9 12.9* 14.3 11.9* 11.9*  HCT 40.4 42.1 38.0* 43.3 35.0* 35.0*  MCV 92.7 90.3  --  91.7  --   --   PLT 195 240  --  273  --   --     Basic Metabolic Panel: Recent Labs  Lab 21-Mar-2019 1033 03/21/19 0555 03/22/19 0009 03/22/19 0115 03/22/19 0152 03/22/19 0433  NA 129* 136 142 142 142 143  K 3.6 3.2* 4.1 4.5 4.1 4.2  CL 95* 101  --  108  --   --   CO2 17* 23  --  24  --   --   GLUCOSE 482* 94  --  193*  --   --   BUN 38* 46*  --  49*  --   --   CREATININE 1.37* 1.17  --  1.10  --   --   CALCIUM 8.8* 8.6*  --  9.1  --   --   MG  --  2.1  --  2.2  --   --    GFR: Estimated Creatinine Clearance: 48.1 mL/min (by C-G formula based on SCr of 1.1 mg/dL). Recent Labs  Lab 2019/03/21 1033 2019/03/21 1245 03/21/19 0555 03/21/19 0612 03/22/19 0115  PROCALCITON 0.68  --   --  11.51 0.70  WBC 14.5*  --  12.3*  --  16.1*  LATICACIDVEN 2.0* 1.4  --   --  1.4    Liver Function Tests: Recent Labs  Lab 21-Mar-2019 1033 03/21/19 0555 03/22/19 0115  AST 32 43* 52*  ALT 37 38 53*  ALKPHOS 82 86 103  BILITOT 1.0 0.6 0.3  PROT 6.5 6.7 7.1  ALBUMIN 3.3* 3.2* 3.1*   No results for input(s): LIPASE, AMYLASE in the last 168 hours. No results for input(s): AMMONIA in the last 168 hours.  ABG    Component Value Date/Time   PHART 7.339 (L) 03/22/2019 0433   PCO2ART 44.1 03/22/2019 0433   PO2ART 51.0 (L) 03/22/2019 0433    HCO3 23.9 03/22/2019 0433   TCO2 25 03/22/2019 0433   ACIDBASEDEF 2.0 03/22/2019 0433   O2SAT 84.0 03/22/2019 0433     Coagulation Profile: No results for input(s): INR, PROTIME in the last 168 hours.  Cardiac Enzymes: No results for input(s): CKTOTAL, CKMB, CKMBINDEX, TROPONINI in the last 168 hours.  HbA1C: Hgb A1c MFr Bld  Date/Time Value Ref Range Status  03-21-19 10:33 AM 9.8 (H) 4.8 - 5.6 % Final    Comment:    (NOTE) Pre diabetes:          5.7%-6.4% Diabetes:              >6.4% Glycemic control for   <7.0% adults with diabetes     CBG: Recent Labs  Lab 03/21/19 0004 03/21/19 0405 03/21/19  5573 03/21/19 1127 03/22/19 0738  GLUCAP 292* 176* 63* 400* 244*    Review of Systems:   Unable to obtain   Past Medical History  He,  has a past medical history of Arthritis, Chronic kidney disease (CKD) stage G1/A1, glomerular filtration rate (GFR) equal to or greater than 90 mL/min/1.73 square meter and albuminuria creatinine ratio less than 30 mg/g, Diabetes mellitus without complication (HCC), Hyperlipidemia, and Hypertension.   Surgical History   History reviewed. No pertinent surgical history.   Social History   reports that he has never smoked. He has never used smokeless tobacco. He reports that he does not drink alcohol or use drugs.   Family History   His family history is not on file.   Allergies Allergies  Allergen Reactions  . Benazepril Cough  . Hydrochlorothiazide Other (See Comments)    SIADH hyponatremia  . Lisinopril Cough  . Losartan Potassium Cough     Home Medications  Prior to Admission medications   Medication Sig Start Date End Date Taking? Authorizing Provider  amLODipine (NORVASC) 10 MG tablet Take 10 mg by mouth daily. 02/16/19  Yes [provider]  aspirin 81 MG EC tablet Take 81 mg by mouth daily.   Yes [provider]  benzonatate (TESSALON) 200 MG capsule Take 200 mg by mouth every 8 (eight) hours as needed  for cough.   Yes [provider]  carvedilol (COREG) 25 MG tablet Take 25 mg by mouth 2 (two) times daily. 12/31/18  Yes [provider]  doxazosin (CARDURA) 1 MG tablet Take 1 mg by mouth daily. 03/17/19  Yes [provider]  doxycycline (VIBRA-TABS) 100 MG tablet Take 100 mg by mouth 2 (two) times daily. 03/18/19  Yes [provider]  metFORMIN (GLUCOPHAGE-XR) 500 MG 24 hr tablet Take 1,000 mg by mouth 2 (two) times daily. 01/19/19  Yes [provider]  Multiple Vitamin (MULTI-VITAMIN) tablet Take 1 tablet by mouth daily.   Yes [provider]  pravastatin (PRAVACHOL) 80 MG tablet Take 80 mg by mouth at bedtime. 01/11/19  Yes [provider]  PULMICORT FLEXHALER 180 MCG/ACT inhaler Inhale 2 puffs into the lungs 2 (two) times daily. 03/18/19  Yes [provider]  TRULICITY 0.75 MG/0.5ML SOPN Inject 0.75 mg into the skin once a week. 03/13/19  Yes [provider]  valsartan (DIOVAN) 160 MG tablet Take 160 mg by mouth daily. 01/11/19  Yes [provider]     Critical care time: 50 min     Levy Pupa, MD, PhD 03/22/2019, 1:01 PM [facility] [phone] or if no answer [phone]

## 2019-03-22 NOTE — Progress Notes (Signed)
35 ml of propofol wasted in stericycle. Witnessed by [employer] RN

## 2019-03-22 NOTE — Progress Notes (Signed)
OT Cancellation Note  Patient Details Name: Marc Young MRN: [id number] DOB: 1945/06/05   Cancelled Treatment:    Reason Eval/Treat Not Completed: Patient not medically ready  Nel Stoneking,HILLARY 03/22/2019, 8:30 AM  Luisa Dago, OT/L   Acute OT Clinical Specialist Acute Rehabilitation Services Pager [phone] Office [phone]

## 2019-03-22 NOTE — Progress Notes (Signed)
RT NOTE:  ABG collected and reported to Dr. Robb Matar @ bedside.  Ph 7.32 PCo2 45.5 PO2 33 HCO3 24.3 SO2 62%

## 2019-03-22 NOTE — Anesthesia Procedure Notes (Signed)
Procedure Name: Intubation Date/Time: 03/22/2019 12:40 AM Performed by: Jhonnie Garner, CRNA Pre-anesthesia Checklist: Patient identified, Suction available, Emergency Drugs available and Patient being monitored Patient Re-evaluated:Patient Re-evaluated prior to induction Oxygen Delivery Method: Ambu bag Preoxygenation: Pre-oxygenation with 100% oxygen Induction Type: IV induction Ventilation: Mask ventilation without difficulty Laryngoscope Size: Glidescope (LoPro 4) Grade View: Grade I Tube type: Oral Tube size: 7.5 mm Number of attempts: 1 Airway Equipment and Method: Stylet and Video-laryngoscopy Placement Confirmation: ETT inserted through vocal cords under direct vision,  CO2 detector and breath sounds checked- equal and bilateral Secured at: 22 cm Tube secured with: Tape Dental Injury: Teeth and Oropharynx as per pre-operative assessment

## 2019-03-22 NOTE — Progress Notes (Signed)
Called pt's spouse Liborio Nixon to update of pt condition.  Liborio Nixon appreciates update. PICC line insertion consent also obtained. Answered pt's questions and she verbalized understanding. Phone consent witnessed by [facility] RN.

## 2019-03-22 NOTE — Progress Notes (Signed)
eLink Physician-Brief Progress Note Patient Name: Marc Young DOB: 1946/01/30 MRN: [id number]   Date of Service  03/22/2019  HPI/Events of Note  Hypoxic respiratory failure d/t COVID-19 pneumonia.   eICU Interventions  Will order: 1. Ventilator settings: 100%/PRVC 18/TV 520/P 12. 2. ABG at 2 AM. 3. Propofol IV infusion. Titrate to RASS = 0 to -1.      Intervention Category Major Interventions: Respiratory failure - evaluation and management  [name] [place] 03/22/2019, 12:53 AM

## 2019-03-22 NOTE — Progress Notes (Signed)
Secure chat with Sonny Dandy Rn re PICC consent.  States he obtained consent from wife earlier today.  RN to place consent on chart.  Aware PICC to be placed in the am 03/23/19.

## 2019-03-22 NOTE — Progress Notes (Signed)
Per orders initiated Nimbex bolus 5mg  one time dose. Continuous drip initiated at 75mcg/kg/min. Patient Rass score -4 to -5. 1m, RN was Naomie Dean on the Rass score of -4 to -5 prior to initiating Nimbex. Patient is tolerating Nimbex well

## 2019-03-22 DEATH — deceased

## 2019-03-23 ENCOUNTER — Inpatient Hospital Stay (HOSPITAL_COMMUNITY): Payer: Medicare Other

## 2019-03-23 LAB — CBC WITH DIFFERENTIAL/PLATELET
Abs Immature Granulocytes: 0.17 10*3/uL — ABNORMAL HIGH (ref 0.00–0.07)
Basophils Absolute: 0 10*3/uL (ref 0.0–0.1)
Basophils Relative: 0 %
Eosinophils Absolute: 0 10*3/uL (ref 0.0–0.5)
Eosinophils Relative: 0 %
HCT: 36.6 % — ABNORMAL LOW (ref 39.0–52.0)
Hemoglobin: 11.9 g/dL — ABNORMAL LOW (ref 13.0–17.0)
Immature Granulocytes: 1 %
Lymphocytes Relative: 6 %
Lymphs Abs: 0.8 10*3/uL (ref 0.7–4.0)
MCH: 30.1 pg (ref 26.0–34.0)
MCHC: 32.5 g/dL (ref 30.0–36.0)
MCV: 92.7 fL (ref 80.0–100.0)
Monocytes Absolute: 0.6 10*3/uL (ref 0.1–1.0)
Monocytes Relative: 5 %
Neutro Abs: 10.4 10*3/uL — ABNORMAL HIGH (ref 1.7–7.7)
Neutrophils Relative %: 88 %
Platelets: 238 10*3/uL (ref 150–400)
RBC: 3.95 MIL/uL — ABNORMAL LOW (ref 4.22–5.81)
RDW: 14.1 % (ref 11.5–15.5)
WBC: 12 10*3/uL — ABNORMAL HIGH (ref 4.0–10.5)
nRBC: 0 % (ref 0.0–0.2)

## 2019-03-23 LAB — COMPREHENSIVE METABOLIC PANEL
ALT: 40 U/L (ref 0–44)
AST: 31 U/L (ref 15–41)
Albumin: 2.6 g/dL — ABNORMAL LOW (ref 3.5–5.0)
Alkaline Phosphatase: 89 U/L (ref 38–126)
Anion gap: 11 (ref 5–15)
BUN: 59 mg/dL — ABNORMAL HIGH (ref 8–23)
CO2: 22 mmol/L (ref 22–32)
Calcium: 8.4 mg/dL — ABNORMAL LOW (ref 8.9–10.3)
Chloride: 111 mmol/L (ref 98–111)
Creatinine, Ser: 1.06 mg/dL (ref 0.61–1.24)
GFR calc Af Amer: >60 mL/min (ref >60)
GFR calc non Af Amer: >60 mL/min (ref >60)
Glucose, Bld: 92 mg/dL (ref 70–99)
Potassium: 3.6 mmol/L (ref 3.5–5.1)
Sodium: 144 mmol/L (ref 135–145)
Total Bilirubin: 0.8 mg/dL (ref 0.3–1.2)
Total Protein: 5.2 g/dL — ABNORMAL LOW (ref 6.5–8.1)

## 2019-03-23 LAB — GLUCOSE, CAPILLARY
Glucose-Capillary: 63 mg/dL — ABNORMAL LOW (ref 70–99)
Glucose-Capillary: 53 mg/dL — ABNORMAL LOW (ref 70–99)
Glucose-Capillary: 149 mg/dL — ABNORMAL HIGH (ref 70–99)
Glucose-Capillary: 132 mg/dL — ABNORMAL HIGH (ref 70–99)
Glucose-Capillary: 200 mg/dL — ABNORMAL HIGH (ref 70–99)

## 2019-03-23 LAB — MAGNESIUM: Magnesium: 2.5 mg/dL — ABNORMAL HIGH (ref 1.7–2.4)

## 2019-03-23 LAB — D-DIMER, QUANTITATIVE: D-Dimer, Quant: 12.72 ug/mL-FEU — ABNORMAL HIGH (ref 0.00–0.50)

## 2019-03-23 LAB — PROCALCITONIN: Procalcitonin: 0.35 ng/mL

## 2019-03-23 LAB — FERRITIN: Ferritin: 520 ng/mL — ABNORMAL HIGH (ref 24–336)

## 2019-03-23 LAB — C-REACTIVE PROTEIN: CRP: 8.9 mg/dL — ABNORMAL HIGH (ref <1.0)

## 2019-03-23 MED ORDER — VITAL HIGH PROTEIN PO LIQD
1000.0000 mL | ORAL | Status: AC
  Administered 2019-03-23: 1000 mL

## 2019-03-23 MED ORDER — SODIUM CHLORIDE 0.9% FLUSH: 10.0000 mL | INTRAVENOUS | Status: DC | PRN

## 2019-03-23 MED ORDER — SODIUM CHLORIDE 0.9% FLUSH
10.0000 mL | Freq: Two times a day (BID) | INTRAVENOUS | Status: DC
  Administered 2019-03-23 – 2019-04-02 (×20): 10 mL
  Administered 2019-04-02: 20 mL
  Administered 2019-04-03 – 2019-04-09 (×11): 10 mL
  Administered 2019-04-09: 30 mL

## 2019-03-23 MED ORDER — DEXTROSE 50 % IV SOLN
INTRAVENOUS | Status: AC
  Administered 2019-03-23: 12:00:00 50 mL
  Filled 2019-03-23: qty 50

## 2019-03-23 MED ORDER — PRO-STAT SUGAR FREE PO LIQD
30.0000 mL | Freq: Two times a day (BID) | ORAL | Status: AC
  Administered 2019-03-23 – 2019-03-24 (×2): 30 mL
  Filled 2019-03-23 (×2): qty 30

## 2019-03-23 NOTE — Progress Notes (Signed)
NAME:  Marc Young, MRN:  [id number], DOB:  1945-12-22, LOS: 3 ADMISSION DATE:  02/24/2019, CONSULTATION DATE: 1/1 REFERRING MD: Dr. Randol Kern, CHIEF COMPLAINT: Acute respiratory failure  Brief History   74 year old man with diabetes and hypertension, admitted 12/30 with extensive bilateral COVID-19 pneumonia.  Required intubation 1/1.  History of present illness   74 year old man with history of diabetes and hypertension, viral symptoms beginning about 12/23.  Initial outpatient Covid testing negative but then COVID-19 positive on repeat.  Scheduled for monoclonal antibody infusion 12/30 but found to be significantly hypoxemic on room air.  Admitted 12/30 for further care.  Had been developing worsening cough, body aches, subjective fever.  Chest x-ray 12/30 with extensive multifocal bilateral infiltrates.  Empiric coverage for CAP initiated.  Corticosteroids, remdesivir started, Actemra given x1.  Experienced progressive respiratory distress following admission, ultimately required intubation mechanical ventilation early 1/1.   Past Medical History   has a past medical history of Arthritis, Chronic kidney disease (CKD) stage G1/A1, glomerular filtration rate (GFR) equal to or greater than 90 mL/min/1.73 square meter and albuminuria creatinine ratio less than 30 mg/g, Diabetes mellitus without complication (HCC), Hyperlipidemia, and Hypertension.   Significant Hospital Events   VDRF 1/1  Consults:  PCCM  Procedures:  ETT 1/1 >>  PICC 1/2 >>   Significant Diagnostic Tests:    Micro Data:  SARS CoV2 12/30 >> positive Blood 12/30 >>  C. difficile 12/31 >> negative MRSA screen 1/1 >> negative  Antimicrobials:  Azithromycin 12/31 >>  Ceftriaxone 12/31 >>   Interim history/subjective:  Intubated sedated and paralyzed 1/1 FiO2 weaned to 0.40, PEEP remains 18  Objective   Blood pressure 137/60, pulse (!) 47, temperature 98 F (36.7 C), temperature source Axillary, resp.  rate (!) 30, height 5\' 3"  (1.6 m), weight 65.8 kg, SpO2 100 %.    Vent Mode: PRVC FiO2 (%):  [40 %-90 %] 40 % Set Rate:  [24 bmp-30 bmp] 30 bmp Vt Set:  [340 mL-400 mL] 340 mL PEEP:  [18 cmH20] 18 cmH20 Plateau Pressure:  [31 cmH20-35 cmH20] 32 cmH20   Intake/Output Summary (Last 24 hours) at 03/23/2019 05/21/2019 Last data filed at 03/23/2019 0600 Gross per 24 hour  Intake 1216.09 ml  Output 1630 ml  Net -413.91 ml   Filed Weights   03/09/2019 1258  Weight: 65.8 kg    Examination: General: elderly man, sedated and intubated HENT: ETT in go position Lungs: coarse B, no wheeze Cardiovascular: distant, regular Abdomen: non-distended, + BS Extremities: no edema Neuro: sedatd, RASS -2 GU: condom cath  Chest x-ray from 1/2 reviewed by me.  Shows bilateral alveolar interstitial infiltrates, right greater than left.  Resolved Hospital Problem list     Assessment & Plan:  Acute hypoxemic respiratory failure due to COVID-19 pneumonia Possible superimposed bacterial CAP Continue to ventilate with low tidal volume ventilation.  Successfully weaned FiO2, work on PEEP on 1/2.  Given improved gas exchange may be able to discontinue paralysis Goal plateau pressure less than 30, driving pressure less than 15 Diuresis as blood pressure and renal function can tolerate, goal CVP 5-8.  Current I/O 151 cc positive Continue dexamethasone for 10 days, remdesivir for 5 days Follow ABG, P/F ratio.  May be able to defer prone positioning 1/2 Continue heavy sedation, RASS -4 VAP prevention protocol Agree with ceftriaxone, azithromycin started 12/31 possible superimposed CAP.  Respiratory culture obtained 1/1, unrevealing so far, continue to follow  Hypertension Continue to hold his amlodipine, carvedilol, doxazosin  Diabetes  mellitus Continue sliding-scale insulin moderate scale Levemir 10 units twice daily  Hyperlipidemia Home Pravachol on hold.  Restart when feasible  Encephalopathy, need for  sedation to facilitate mechanical ventilation RASS goal -4.  Transitioned propofol to fentanyl, Versed   Best practice:  Diet: Tube feeding to start 1/2 Pain/Anxiety/Delirium protocol (if indicated): PAD protocol, plan Versed, fentanyl VAP protocol (if indicated): As ordered DVT prophylaxis: Enoxaparin twice daily GI prophylaxis: Pepcid Glucose control: Sliding-scale insulin, Levemir Mobility: Bedrest Code Status: Full code Family Communication:  Disposition: ICU  Labs   CBC: Recent Labs  Lab 03/18/2019 1033 03/21/19 0555 03/22/19 0009 03/22/19 0115 03/22/19 0152 03/22/19 0433 03/22/19 1136  WBC 14.5* 12.3*  --  16.1*  --   --   --   NEUTROABS 13.2* 11.5*  --  14.7*  --   --   --   HGB 13.2 13.9 12.9* 14.3 11.9* 11.9* 11.2*  HCT 40.4 42.1 38.0* 43.3 35.0* 35.0* 33.0*  MCV 92.7 90.3  --  91.7  --   --   --   PLT 195 240  --  273  --   --   --     Basic Metabolic Panel: Recent Labs  Lab 03/09/2019 1033 03/21/19 0555 03/22/19 0115 03/22/19 0152 03/22/19 0433 03/22/19 1136 03/23/19 0505  NA 129* 136 142 142 143 143 144  K 3.6 3.2* 4.5 4.1 4.2 3.9 3.6  CL 95* 101 108  --   --   --  111  CO2 17* 23 24  --   --   --  22  GLUCOSE 482* 94 193*  --   --   --  92  BUN 38* 46* 49*  --   --   --  59*  CREATININE 1.37* 1.17 1.10  --   --   --  1.06  CALCIUM 8.8* 8.6* 9.1  --   --   --  8.4*  MG  --  2.1 2.2  --   --   --  2.5*   GFR: Estimated Creatinine Clearance: 50 mL/min (by C-G formula based on SCr of 1.06 mg/dL). Recent Labs  Lab 03/18/2019 1033 03/07/2019 1245 03/21/19 0555 03/21/19 0612 03/22/19 0115  PROCALCITON 0.68  --   --  11.51 0.70  WBC 14.5*  --  12.3*  --  16.1*  LATICACIDVEN 2.0* 1.4  --   --  1.4    Liver Function Tests: Recent Labs  Lab 02/19/2019 1033 03/21/19 0555 03/22/19 0115 03/23/19 0505  AST 32 43* 52* 31  ALT 37 38 53* 40  ALKPHOS 82 86 103 89  BILITOT 1.0 0.6 0.3 0.8  PROT 6.5 6.7 7.1 5.2*  ALBUMIN 3.3* 3.2* 3.1* 2.6*   No  results for input(s): LIPASE, AMYLASE in the last 168 hours. No results for input(s): AMMONIA in the last 168 hours.  ABG    Component Value Date/Time   PHART 7.328 (L) 03/22/2019 1136   PCO2ART 44.1 03/22/2019 1136   PO2ART 154.0 (H) 03/22/2019 1136   HCO3 23.2 03/22/2019 1136   TCO2 25 03/22/2019 1136   ACIDBASEDEF 3.0 (H) 03/22/2019 1136   O2SAT 99.0 03/22/2019 1136     Coagulation Profile: No results for input(s): INR, PROTIME in the last 168 hours.  Cardiac Enzymes: No results for input(s): CKTOTAL, CKMB, CKMBINDEX, TROPONINI in the last 168 hours.  HbA1C: Hgb A1c MFr Bld  Date/Time Value Ref Range Status  03/15/2019 10:33 AM 9.8 (H) 4.8 - 5.6 % Final  Comment:    (NOTE) Pre diabetes:          5.7%-6.4% Diabetes:              >6.4% Glycemic control for   <7.0% adults with diabetes     CBG: Recent Labs  Lab 03/21/19 1127 03/22/19 0738 03/22/19 1330 03/22/19 1542 03/22/19 2114  GLUCAP 400* 244* 202* 170* 137*     Critical care time: 33 min     Baltazar Apo, MD, PhD 03/23/2019, 7:22 AM [facility] [phone] or if no answer [phone]

## 2019-03-23 NOTE — Progress Notes (Signed)
Wife called in and was updated of pt's condition. All her questions were answered.

## 2019-03-23 NOTE — Plan of Care (Signed)
  Problem: Respiratory: Goal: Will maintain a patent airway Outcome: Progressing Goal: Complications related to the disease process, condition or treatment will be avoided or minimized Outcome: Progressing   Problem: Clinical Measurements: Goal: Ability to maintain clinical measurements within normal limits will improve Outcome: Progressing Goal: Will remain free from infection Outcome: Progressing Goal: Diagnostic test results will improve Outcome: Progressing Goal: Respiratory complications will improve Outcome: Progressing Goal: Cardiovascular complication will be avoided Outcome: Progressing   Problem: Nutrition: Goal: Adequate nutrition will be maintained Outcome: Progressing   Problem: Safety: Goal: Ability to remain free from injury will improve Outcome: Progressing   Problem: Skin Integrity: Goal: Risk for impaired skin integrity will decrease Outcome: Progressing   Problem: Education: Goal: Knowledge of risk factors and measures for prevention of condition will improve Outcome: Not Progressing   Problem: Coping: Goal: Psychosocial and spiritual needs will be supported Outcome: Not Progressing

## 2019-03-23 NOTE — Progress Notes (Signed)
[name] was able to achieve sedation today with a RASS goal of -5. Per Provider Marc Young we planned to decrease PEEP on the vent which he was able to tolerate from 14PEEP to 12 PEEP. In an attempt to stop nimbex, the patient desaturated and the nimbex needed to be restarted at a lower rate to maintain vent settings. He remains on FIO2 of 40% and PEEP of 12. [name] had 2 episodes of low glucose today, and it was discussed with the provider to initiate tube feedings. Mr, Marc Young was given 1 amp of D50 to which his hypoglycemia was corrected and he was able to maintain his glucose level until he started his tube feeding this evening. Since [name] has been bladder scanned for the past 24 hours and had little urine output Dr. Delton Young agreed that a foley catheter needed to be placed. [name] was able to produce close to 1 ltr of urine. With the additional IV needs [name] received a RUA TL PICC line that was placed and confirmed by IV Team. [name] now had ample access to maintain his IV therapy regimine. I spoke to Mrs. Marc Young today x2 and gave her updates on her husbands status and all the changes that were planned and those that came to fruition. She was in agreement with the plan of care and changes made.

## 2019-03-23 NOTE — Progress Notes (Signed)
Peripherally Inserted Central Catheter/Midline Placement  The IV Nurse has discussed with the patient and/or persons authorized to consent for the patient, the purpose of this procedure and the potential benefits and risks involved with this procedure.  The benefits include less needle sticks, lab draws from the catheter, and the patient may be discharged home with the catheter. Risks include, but not limited to, infection, bleeding, blood clot (thrombus formation), and puncture of an artery; nerve damage and irregular heartbeat and possibility to perform a PICC exchange if needed/ordered by physician.  Alternatives to this procedure were also discussed.  Bard Power PICC patient education guide, fact sheet on infection prevention and patient information card has been provided to patient /or left at bedside.  Telephone consent obtained from wife, Liborio Nixon.  PICC/Midline Placement Documentation  PICC Triple Lumen 03/23/19 PICC Right Brachial 38 cm 1 cm (Active)  Indication for Insertion or Continuance of Line Vasoactive infusions 03/23/19 1114  Exposed Catheter (cm) 1 cm 03/23/19 1114  Site Assessment Clean;Dry;Intact 03/23/19 1114  Lumen #1 Status Flushed;Saline locked;Blood return noted 03/23/19 1114  Lumen #2 Status Flushed;Saline locked;Blood return noted 03/23/19 1114  Lumen #3 Status Flushed;Saline locked;Blood return noted 03/23/19 1114  Dressing Type Transparent 03/23/19 1114  Dressing Status Clean;Dry;Intact;Antimicrobial disc in place 03/23/19 1114  Line Care Connections checked and tightened 03/23/19 1114  Line Adjustment (NICU/IV Team Only) No 03/23/19 1114  Dressing Intervention New dressing 03/23/19 1114  Dressing Change Due 03/30/19 03/23/19 1114       Elliot Dally 03/23/2019, 11:15 AM

## 2019-03-23 NOTE — Progress Notes (Signed)
Spoke on the phone with patient's wife. Updated her to the patient's current condition and plan of care. She said she had no further questions at this time.

## 2019-03-24 ENCOUNTER — Inpatient Hospital Stay (HOSPITAL_COMMUNITY): Payer: Medicare Other

## 2019-03-24 DIAGNOSIS — K922 Gastrointestinal hemorrhage, unspecified: Secondary | ICD-10-CM

## 2019-03-24 LAB — GLUCOSE, CAPILLARY
Glucose-Capillary: 148 mg/dL — ABNORMAL HIGH (ref 70–99)
Glucose-Capillary: 194 mg/dL — ABNORMAL HIGH (ref 70–99)
Glucose-Capillary: 267 mg/dL — ABNORMAL HIGH (ref 70–99)
Glucose-Capillary: 293 mg/dL — ABNORMAL HIGH (ref 70–99)
Glucose-Capillary: 309 mg/dL — ABNORMAL HIGH (ref 70–99)
Glucose-Capillary: 327 mg/dL — ABNORMAL HIGH (ref 70–99)
Glucose-Capillary: 335 mg/dL — ABNORMAL HIGH (ref 70–99)
Glucose-Capillary: 336 mg/dL — ABNORMAL HIGH (ref 70–99)
Glucose-Capillary: 358 mg/dL — ABNORMAL HIGH (ref 70–99)

## 2019-03-24 LAB — COMPREHENSIVE METABOLIC PANEL
ALT: 38 U/L (ref 0–44)
AST: 28 U/L (ref 15–41)
Albumin: 2.2 g/dL — ABNORMAL LOW (ref 3.5–5.0)
Alkaline Phosphatase: 83 U/L (ref 38–126)
Anion gap: 10 (ref 5–15)
BUN: 50 mg/dL — ABNORMAL HIGH (ref 8–23)
CO2: 22 mmol/L (ref 22–32)
Calcium: 7.8 mg/dL — ABNORMAL LOW (ref 8.9–10.3)
Chloride: 113 mmol/L — ABNORMAL HIGH (ref 98–111)
Creatinine, Ser: 1.04 mg/dL (ref 0.61–1.24)
GFR calc Af Amer: >60 mL/min (ref >60)
GFR calc non Af Amer: >60 mL/min (ref >60)
Glucose, Bld: 356 mg/dL — ABNORMAL HIGH (ref 70–99)
Potassium: 3.8 mmol/L (ref 3.5–5.1)
Sodium: 145 mmol/L (ref 135–145)
Total Bilirubin: 0.5 mg/dL (ref 0.3–1.2)
Total Protein: 4.9 g/dL — ABNORMAL LOW (ref 6.5–8.1)

## 2019-03-24 LAB — CBC
HCT: 29 % — ABNORMAL LOW (ref 39.0–52.0)
Hemoglobin: 9.3 g/dL — ABNORMAL LOW (ref 13.0–17.0)
MCH: 31.4 pg (ref 26.0–34.0)
MCHC: 32.1 g/dL (ref 30.0–36.0)
MCV: 98 fL (ref 80.0–100.0)
Platelets: 300 10*3/uL (ref 150–400)
RBC: 2.96 MIL/uL — ABNORMAL LOW (ref 4.22–5.81)
RDW: 14.6 % (ref 11.5–15.5)
WBC: 16.8 10*3/uL — ABNORMAL HIGH (ref 4.0–10.5)
nRBC: 0.5 % — ABNORMAL HIGH (ref 0.0–0.2)

## 2019-03-24 LAB — HEMOGLOBIN AND HEMATOCRIT, BLOOD
HCT: 40.6 % (ref 39.0–52.0)
HCT: 37.8 % — ABNORMAL LOW (ref 39.0–52.0)
Hemoglobin: 13.2 g/dL (ref 13.0–17.0)
Hemoglobin: 12.1 g/dL — ABNORMAL LOW (ref 13.0–17.0)

## 2019-03-24 LAB — TYPE AND SCREEN
ABO/RH(D): O POS
Antibody Screen: NEGATIVE

## 2019-03-24 LAB — PROTIME-INR
INR: 1.4 — ABNORMAL HIGH (ref 0.8–1.2)
Prothrombin Time: 16.7 seconds — ABNORMAL HIGH (ref 11.4–15.2)

## 2019-03-24 LAB — CBC WITH DIFFERENTIAL/PLATELET
Abs Immature Granulocytes: 0.25 10*3/uL — ABNORMAL HIGH (ref 0.00–0.07)
Basophils Absolute: 0 10*3/uL (ref 0.0–0.1)
Basophils Relative: 0 %
Eosinophils Absolute: 0 10*3/uL (ref 0.0–0.5)
Eosinophils Relative: 0 %
HCT: 38.5 % — ABNORMAL LOW (ref 39.0–52.0)
Hemoglobin: 12.5 g/dL — ABNORMAL LOW (ref 13.0–17.0)
Immature Granulocytes: 2 %
Lymphocytes Relative: 4 %
Lymphs Abs: 0.4 10*3/uL — ABNORMAL LOW (ref 0.7–4.0)
MCH: 30.2 pg (ref 26.0–34.0)
MCHC: 32.5 g/dL (ref 30.0–36.0)
MCV: 93 fL (ref 80.0–100.0)
Monocytes Absolute: 0.7 10*3/uL (ref 0.1–1.0)
Monocytes Relative: 6 %
Neutro Abs: 10 10*3/uL — ABNORMAL HIGH (ref 1.7–7.7)
Neutrophils Relative %: 88 %
Platelets: 250 10*3/uL (ref 150–400)
RBC: 4.14 MIL/uL — ABNORMAL LOW (ref 4.22–5.81)
RDW: 14.1 % (ref 11.5–15.5)
WBC: 11.4 10*3/uL — ABNORMAL HIGH (ref 4.0–10.5)
nRBC: 0 % (ref 0.0–0.2)

## 2019-03-24 LAB — CULTURE, RESPIRATORY W GRAM STAIN: Culture: NO GROWTH

## 2019-03-24 LAB — FERRITIN: Ferritin: 370 ng/mL — ABNORMAL HIGH (ref 24–336)

## 2019-03-24 LAB — MAGNESIUM: Magnesium: 2.3 mg/dL (ref 1.7–2.4)

## 2019-03-24 LAB — PHOSPHORUS: Phosphorus: 3.9 mg/dL (ref 2.5–4.6)

## 2019-03-24 LAB — C-REACTIVE PROTEIN: CRP: 5.1 mg/dL — ABNORMAL HIGH (ref <1.0)

## 2019-03-24 LAB — PROCALCITONIN: Procalcitonin: 0.11 ng/mL

## 2019-03-24 LAB — D-DIMER, QUANTITATIVE: D-Dimer, Quant: 10.68 ug/mL-FEU — ABNORMAL HIGH (ref 0.00–0.50)

## 2019-03-24 LAB — PREPARE RBC (CROSSMATCH)

## 2019-03-24 MED ORDER — INSULIN ASPART 100 UNIT/ML ~~LOC~~ SOLN
2.0000 [IU] | SUBCUTANEOUS | Status: DC
  Administered 2019-03-24 (×3): 2 [IU] via SUBCUTANEOUS

## 2019-03-24 MED ORDER — SODIUM CHLORIDE 0.9 % IV BOLUS
1000.0000 mL | Freq: Once | INTRAVENOUS | Status: AC
  Administered 2019-03-24: 1000 mL via INTRAVENOUS

## 2019-03-24 MED ORDER — INSULIN DETEMIR 100 UNIT/ML ~~LOC~~ SOLN
15.0000 [IU] | Freq: Two times a day (BID) | SUBCUTANEOUS | Status: DC
  Administered 2019-03-24: 15 [IU] via SUBCUTANEOUS
  Filled 2019-03-24 (×2): qty 0.15

## 2019-03-24 MED ORDER — VITAL AF 1.2 CAL PO LIQD: 1000.0000 mL | ORAL | Status: DC

## 2019-03-24 MED ORDER — DEXTROSE 10 % IV SOLN: INTRAVENOUS | Status: DC

## 2019-03-24 MED ORDER — PANTOPRAZOLE SODIUM 40 MG IV SOLR
40.0000 mg | Freq: Two times a day (BID) | INTRAVENOUS | Status: DC
  Administered 2019-03-24 – 2019-03-26 (×6): 40 mg via INTRAVENOUS
  Filled 2019-03-24 (×6): qty 40

## 2019-03-24 MED ORDER — INSULIN REGULAR(HUMAN) IN NACL 100-0.9 UT/100ML-% IV SOLN: INTRAVENOUS | Status: DC

## 2019-03-24 MED ORDER — DEXTROSE 50 % IV SOLN
0.0000 mL | INTRAVENOUS | Status: DC | PRN
  Administered 2019-03-25: 03:00:00 30 mL via INTRAVENOUS
  Administered 2019-03-25: 17:00:00 25 mL via INTRAVENOUS
  Filled 2019-03-24 (×2): qty 50

## 2019-03-24 MED ORDER — PHENYLEPHRINE HCL-NACL 10-0.9 MG/250ML-% IV SOLN
INTRAVENOUS | Status: AC
  Administered 2019-03-24: 50 ug/min via INTRAVENOUS
  Filled 2019-03-24: qty 250

## 2019-03-24 MED ORDER — INSULIN DETEMIR 100 UNIT/ML ~~LOC~~ SOLN
18.0000 [IU] | Freq: Two times a day (BID) | SUBCUTANEOUS | Status: DC
  Filled 2019-03-24 (×2): qty 0.18

## 2019-03-24 MED ORDER — DEXTROSE-NACL 5-0.45 % IV SOLN: INTRAVENOUS | Status: DC

## 2019-03-24 MED ORDER — INSULIN REGULAR(HUMAN) IN NACL 100-0.9 UT/100ML-% IV SOLN
INTRAVENOUS | Status: DC
  Administered 2019-03-24: 4.8 [IU]/h via INTRAVENOUS
  Filled 2019-03-24: qty 100

## 2019-03-24 MED ORDER — SODIUM CHLORIDE 0.9 % IV SOLN: INTRAVENOUS | Status: DC

## 2019-03-24 MED ORDER — PHENYLEPHRINE HCL-NACL 10-0.9 MG/250ML-% IV SOLN
0.0000 ug/min | INTRAVENOUS | Status: DC
  Administered 2019-03-24: 14:00:00 140 ug/min via INTRAVENOUS
  Administered 2019-03-24: 21:00:00 15 ug/min via INTRAVENOUS
  Administered 2019-03-24: 11:00:00 140 ug/min via INTRAVENOUS
  Administered 2019-03-25: 09:00:00 40 ug/min via INTRAVENOUS
  Administered 2019-03-25: 10 ug/min via INTRAVENOUS
  Administered 2019-03-25: 04:00:00 70 ug/min via INTRAVENOUS
  Administered 2019-03-25: 02:00:00 50 ug/min via INTRAVENOUS
  Administered 2019-03-25: 06:00:00 70 ug/min via INTRAVENOUS
  Filled 2019-03-24 (×4): qty 250
  Filled 2019-03-24: qty 500
  Filled 2019-03-24 (×3): qty 250

## 2019-03-24 MED ORDER — SODIUM CHLORIDE 0.9% IV SOLUTION: Freq: Once | INTRAVENOUS | Status: AC

## 2019-03-24 MED ORDER — DEXTROSE 50 % IV SOLN: 0.0000 mL | INTRAVENOUS | Status: DC | PRN

## 2019-03-24 MED ORDER — INSULIN ASPART 100 UNIT/ML ~~LOC~~ SOLN
3.0000 [IU] | SUBCUTANEOUS | Status: DC
  Administered 2019-03-24 (×2): 9 [IU] via SUBCUTANEOUS

## 2019-03-24 NOTE — Progress Notes (Addendum)
Elink notified that OG contents that had been brown/bile have changed to maroon colored. Repeat H/H stable. Next H/H due around 0400. Will continue to monitor.

## 2019-03-24 NOTE — Progress Notes (Signed)
NAME:  Marc Young, MRN:  [id number], DOB:  [birth year], LOS: 4 ADMISSION DATE:  05-Apr-2019, CONSULTATION DATE: 1/1 REFERRING MD: Dr. Randol Kern, CHIEF COMPLAINT: Acute respiratory failure  Brief History   74 year old man with diabetes and hypertension, admitted 12/30 with extensive bilateral COVID-19 pneumonia.  Required intubation 1/1.  History of present illness   74 year old man with history of diabetes and hypertension, viral symptoms beginning about 12/23.  Initial outpatient Covid testing negative but then COVID-19 positive on repeat.  Scheduled for monoclonal antibody infusion 12/30 but found to be significantly hypoxemic on room air.  Admitted 12/30 for further care.  Had been developing worsening cough, body aches, subjective fever.  Chest x-ray 12/30 with extensive multifocal bilateral infiltrates.  Empiric coverage for CAP initiated.  Corticosteroids, remdesivir started, Actemra given x1.  Experienced progressive respiratory distress following admission, ultimately required intubation mechanical ventilation early 1/1.   Past Medical History   has a past medical history of Arthritis, Chronic kidney disease (CKD) stage G1/A1, glomerular filtration rate (GFR) equal to or greater than 90 mL/min/1.73 square meter and albuminuria creatinine ratio less than 30 mg/g, Diabetes mellitus without complication (HCC), Hyperlipidemia, and Hypertension.   Significant Hospital Events   VDRF 1/1  Consults:  PCCM  Procedures:  ETT 1/1 >>  PICC 1/2 >>   Significant Diagnostic Tests:    Micro Data:  SARS CoV2 12/30 >> positive Blood 12/30 >>  C. difficile 12/31 >> negative MRSA screen 1/1 >> negative  Antimicrobials:  Azithromycin 12/31 >>  Ceftriaxone 12/31 >>   Interim history/subjective:  Hypotension overnight, phenylephrine ordered Dark maroon stools x 2 noted this am He was paralyzed 1/2 for vent synchrony Hyperglycemia overnight, insulin drip initially considered, then  discontinued.  Not currently on any glucose coverage FiO2 0.40, PEEP 10  Objective   Blood pressure 113/60, pulse 92, temperature 98.2 F (36.8 C), temperature source Oral, resp. rate (!) 30, height 5\' 3"  (1.6 m), weight 65.8 kg, SpO2 95 %. CVP:  [4 mmHg] 4 mmHg  Vent Mode: PRVC FiO2 (%):  [40 %] 40 % Set Rate:  [30 bmp] 30 bmp Vt Set:  [338 mL-340 mL] 340 mL PEEP:  [10 cmH20-18 cmH20] 10 cmH20 Plateau Pressure:  [20 cmH20-32 cmH20] 20 cmH20   Intake/Output Summary (Last 24 hours) at 03/24/2019 0739 Last data filed at 03/24/2019 0700 Gross per 24 hour  Intake 2802.37 ml  Output 2100 ml  Net 702.37 ml   Filed Weights   04-05-19 1258  Weight: 65.8 kg    Examination: General: elderly man, sedated and intubated HENT: ETT in go position Lungs: coarse B, no wheeze Cardiovascular: distant, regular Abdomen: non-distended, + BS Extremities: no edema Neuro: sedatd, RASS -2 GU: condom cath  Chest x-ray from 1/2 reviewed by me.  Shows bilateral alveolar interstitial infiltrates, right greater than left.  Resolved Hospital Problem list     Assessment & Plan:  Acute hypoxemic respiratory failure due to COVID-19 pneumonia Possible superimposed bacterial CAP Low tidal volume ventilator strategy.  Now that FiO2 0.40, plan to lip paralysis, work on weaning PEEP further 1/3 Goal plateau pressure less than 30, dry embolism 15 Continue diuresis as blood pressure and renal function can tolerate (hypotension overnight).  Goal CVP 5-8.  Currently I/O+ 854 total Plan remdesivir for 5 days, hold steroids for now for GIB Prone positioning if necessary for oxygenation Continue heavy sedation, RASS -4 VAP prevention protocol Continue ceftriaxone, plan azithromycin 5 days.  Respiratory culture 1/1 unrevealing so far  Shock,  presumed septic shock with possible superimposed effects of sedating medications Phenylephrine infusion started overnight.  Wean as able, goal MAP 65  Diarrhea and now  evidence for GIB Follow freq CBC, type and screen now Temporarily  hold TF and do gastric lavage to eval for blood, coffee grounds Hold steroids and anticoagulation Will consult with gastroenterology, may need to move to [facility] in order to facilitate any procedures or further evaluation.  History of hypertension Continue to hold his amlodipine, carvedilol, doxazosin  Diabetes mellitus His sliding scale was stopped on 1/2 overnight, was initially going to start insulin infusion then deferred.  CBGs have been in the 300s, tube feedings now running Restart Levemir at 15 units twice daily Reinstitute sliding scale insulin 1/3  Hyperlipidemia Home Pravachol on hold for now  Encephalopathy, need for sedation to facilitate mechanical ventilation Transition to fentanyl, Versed, RASS goal negative for until improved gas exchange   Best practice:  Diet: Tube feeding started 1/2 Pain/Anxiety/Delirium protocol (if indicated): PAD protocol, plan Versed, fentanyl VAP protocol (if indicated): As ordered DVT prophylaxis: Enoxaparin twice daily GI prophylaxis: Pepcid Glucose control: Sliding-scale insulin, Levemir Mobility: Bedrest Code Status: Full code Family Communication: Discussed the patient's status, interval change, plans for transfer to [facility] and GI evaluation with his wife Liborio Nixon by phone on 1/3. Disposition: ICU  Labs   CBC: Recent Labs  Lab 03/04/2019 1033 03/21/19 0555 03/22/19 0115 03/22/19 0152 03/22/19 0433 03/22/19 1136 03/23/19 0505 03/24/19 0318  WBC 14.5* 12.3* 16.1*  --   --   --  12.0* 11.4*  NEUTROABS 13.2* 11.5* 14.7*  --   --   --  10.4* 10.0*  HGB 13.2 13.9 14.3 11.9* 11.9* 11.2* 11.9* 12.5*  HCT 40.4 42.1 43.3 35.0* 35.0* 33.0* 36.6* 38.5*  MCV 92.7 90.3 91.7  --   --   --  92.7 93.0  PLT 195 240 273  --   --   --  238 250    Basic Metabolic Panel: Recent Labs  Lab 02/26/2019 1033 03/21/19 0555 03/22/19 0115 03/22/19 0152 03/22/19 0433 03/22/19 1136  03/23/19 0505 03/24/19 0318  NA 129* 136 142 142 143 143 144 145  K 3.6 3.2* 4.5 4.1 4.2 3.9 3.6 3.8  CL 95* 101 108  --   --   --  111 113*  CO2 17* 23 24  --   --   --  22 22  GLUCOSE 482* 94 193*  --   --   --  92 356*  BUN 38* 46* 49*  --   --   --  59* 50*  CREATININE 1.37* 1.17 1.10  --   --   --  1.06 1.04  CALCIUM 8.8* 8.6* 9.1  --   --   --  8.4* 7.8*  MG  --  2.1 2.2  --   --   --  2.5* 2.3  PHOS  --   --   --   --   --   --   --  3.9   GFR: Estimated Creatinine Clearance: 50.9 mL/min (by C-G formula based on SCr of 1.04 mg/dL). Recent Labs  Lab 02/19/2019 1033 02/26/2019 1245 03/21/19 0555 03/21/19 0612 03/22/19 0115 03/23/19 0505 03/24/19 0318  PROCALCITON 0.68  --   --  11.51 0.70 0.35 0.11  WBC 14.5*  --  12.3*  --  16.1* 12.0* 11.4*  LATICACIDVEN 2.0* 1.4  --   --  1.4  --   --  Liver Function Tests: Recent Labs  Lab 03/18/2019 1033 03/21/19 0555 03/22/19 0115 03/23/19 0505 03/24/19 0318  AST 32 43* 52* 31 28  ALT 37 38 53* 40 38  ALKPHOS 82 86 103 89 83  BILITOT 1.0 0.6 0.3 0.8 0.5  PROT 6.5 6.7 7.1 5.2* 4.9*  ALBUMIN 3.3* 3.2* 3.1* 2.6* 2.2*   No results for input(s): LIPASE, AMYLASE in the last 168 hours. No results for input(s): AMMONIA in the last 168 hours.  ABG    Component Value Date/Time   PHART 7.328 (L) 03/22/2019 1136   PCO2ART 44.1 03/22/2019 1136   PO2ART 154.0 (H) 03/22/2019 1136   HCO3 23.2 03/22/2019 1136   TCO2 25 03/22/2019 1136   ACIDBASEDEF 3.0 (H) 03/22/2019 1136   O2SAT 99.0 03/22/2019 1136     Coagulation Profile: No results for input(s): INR, PROTIME in the last 168 hours.  Cardiac Enzymes: No results for input(s): CKTOTAL, CKMB, CKMBINDEX, TROPONINI in the last 168 hours.  HbA1C: Hgb A1c MFr Bld  Date/Time Value Ref Range Status  02/26/2019 10:33 AM 9.8 (H) 4.8 - 5.6 % Final    Comment:    (NOTE) Pre diabetes:          5.7%-6.4% Diabetes:              >6.4% Glycemic control for   <7.0% adults with  diabetes     CBG: Recent Labs  Lab 03/23/19 2152 03/24/19 0015 03/24/19 0258 03/24/19 0638 03/24/19 0724  GLUCAP 200* 309* 293* 327* 336*     Critical care time: 28 min     Baltazar Apo, MD, PhD 03/24/2019, 7:39 AM [facility] [phone] or if no answer [phone]

## 2019-03-24 NOTE — Progress Notes (Deleted)
Urine output has been trending down since this afternoon. Pt only putting out about 10 - 15 cc's every hour. CVP 16. Pt on small amount of Neo. This information was relayed to [facility]. New stat labs ordered. Will contihnue to monitor.

## 2019-03-24 NOTE — Progress Notes (Signed)
CBG at midnight=309. CCM made aware. Insulin infusion was prescribed but was D/C by hospitalist. Hospitalist made aware that SQ coverage of insulin were discontinued as pt was supposed to start on IV insulin. No further orders received. @ 0545 after turning the pt. BP dropping to 70's systolic. CVP=4. Ccm made aware 0.9 NACl 1 liter bolus and neo synephrine started.

## 2019-03-24 NOTE — Progress Notes (Signed)
Patient's hospital computer chart reviewed and case discussed with hospital team while he was at [facility] and he began having bright red blood per rectum earlier today and has no GI history listed and he was on aspirin at home and Lovenox in the hospital and is intubated on tube feeds and no additional history was obtained and he did drop his hemoglobin a few grams but his BUN actually went down and his platelet count is okay and my recommendations were to consider nuclear bleeding scan if bright red blood continues and lavage his feeding tube if able to see if any signs of active bleeding in the upper track and call me back today if signs of active bleeding or other question or problem otherwise we will ask the rounding team to check on tomorrow and agree with holding anticoagulation making n.p.o. using pump inhibitors and transfusing as needed for now

## 2019-03-24 NOTE — Progress Notes (Signed)
Initial Nutrition Assessment  INTERVENTION:   -D/c Vital HP -Provide Vital AF 1.2 @ 65 ml/hr via OGT -This will provide 1872 kcals (98% of needs), 117g protein and 1265 ml H2O. -Daily weights  NUTRITION DIAGNOSIS:   Increased nutrient needs related to acute illness(COVID-19 infection) as evidenced by estimated needs.  GOAL:   Patient will meet greater than or equal to 90% of their needs  MONITOR:   Vent status, Labs, Weight trends, TF tolerance, I & O's  REASON FOR ASSESSMENT:   Consult, Ventilator Enteral/tube feeding initiation and management  ASSESSMENT:   74 year old man with history of diabetes and hypertension, viral symptoms beginning about 12/23.  Initial outpatient Covid testing negative but then COVID-19 positive on repeat.  Scheduled for monoclonal antibody infusion 12/30 but found to be significantly hypoxemic on room air.  Admitted 12/30 for further care.  Had been developing worsening cough, body aches, subjective fever.  Chest x-ray 12/30 with extensive multifocal bilateral infiltrates.  Empiric coverage for CAP initiated.  Corticosteroids, remdesivir started, Actemra given x1.  Experienced progressive respiratory distress following admission, ultimately required intubation mechanical ventilation early 1/1.  12/30: admitted with bilateral COVID-19 pneumonia 1/1: intubated  **RD working remotely**  Patient is currently intubated on ventilator support MV: 10.1 L/min Temp (24hrs), Avg:98.3 F (36.8 C), Min:97.9 F (36.6 C), Max:98.9 F (37.2 C)  Patient currently sedated. Pt on pressors. Pt currently receiving Vital HP @ 40 ml/hr with 30 ml Prostat BID (providing 1160 kcals and 114g protein). Will adjust TF order to better meet estimated needs.   Admission weight: 145 lbs. No further weights have been measured. Will order daily weights.  Medications: Vitamin C tablet, Zinc sulfate capsule, Nimbex infusion, Fentanyl infusion, Versed infusion, Phenylephrine  infusion  Labs reviewed: CBGs: 327-336 Mg/Phos WNL  NUTRITION - FOCUSED PHYSICAL EXAM:  Working remotely.  Diet Order:   Diet Order            Diet NPO time specified  Diet effective now              EDUCATION NEEDS:   No education needs have been identified at this time  Skin:  Skin Assessment: Reviewed RN Assessment  Last BM:  1/1  Height:   Ht Readings from Last 1 Encounters:  03/22/19 5\' 3"  (1.6 m)    Weight:   Wt Readings from Last 1 Encounters:  03/06/2019 65.8 kg    Ideal Body Weight:  56.3 kg  BMI:  Body mass index is 25.69 kg/m.  Estimated Nutritional Needs:   Kcal:  1900-2100  Protein:  100-125g  Fluid:  2L/day  03/22/19, MS, RD, LDN Inpatient Clinical Dietitian Pager: [phone] After Hours Pager: [phone]

## 2019-03-24 NOTE — Progress Notes (Signed)
eLink Physician-Brief Progress Note Patient Name: Marc Young DOB: 01/23/1946 MRN: [id number]   Date of Service  03/24/2019  HPI/Events of Note  CXR review requested on this intubated patient.  eICU Interventions  Reviewed CXRs from 16 hours ago as well as from 2.5 hours ago. The major difference between these films is movement of the tip of the ETT from 3cm above carina to ~7cm above carina.  Spoke with RN via in-room camera. She informs me that the ETT has since been advanced 4cm by RT (to a depth of 24cm at teeth).  No changes / new orders required at this time.     Intervention Category Intermediate Interventions: Other:  Janae Bridgeman 03/24/2019, 7:49 PM

## 2019-03-24 NOTE — Progress Notes (Signed)
Mr. Sliter was observed with low bp during report this am. He had received a 1 ltr NS fluid bolus overnight, when RN performed peri care it ws noted that the pt had a gi bleed type stool with bright red blood. Provider Byrum was notified via chat messaging with quick response - anticoagulants held, BP was dipping to 60' systolic so Neo was titrated from to 140 mcgs to maintain pressure. Additionally, Mr. Seyfried's glucose was elevated over 300. Per provider request, no insulin drip started as Dr Delton Coombes was tying to decrease his fluid volume. Mr. Sevillano was covered with sliding scale insulin. During am rounds Dr. Delton Coombes requested me to stop the Nimbex, and maintain bp per protocol with the Neo. Around 10AM, Mr.Patmon had 3 more episodes of grossly bloody liquid stool and he was not adapting to the nimbex removal. He became asynchronus with the vent and his oxygen saturation was dropping even with suctioning and no return of fluids upon suction. Whitney NP came to the bedside and agreed to start Nimbex back and was able to observe the stool. A cbc had been ordered and when it resulted, Mr. Kearley had dropped from a hemoglobin over 12 at 3AM to a hemoglobin of just over 9 at 10AM. The significant dropped and continued output of bloody stool escalated the need for packed red blood cells to be transfused. I called Liborio Nixon, the patient's wife, and obtained consent for blood verbally and explained her husbands condition and answered questions. Liborio Nixon was understanding of the information relayed along with a plan for possible transfer of her husband to a facility with GI specialists. Mrs. Kelter was in agreement with the plan and asked to be updated on her husbands condition and changes. Mr. Gellatly's nimbex was increased to 7.72ml's per hour and his neo was running at 112mcg's per hour so his blood pressure stabilized and he was comfortable on the vent with 100% FIO2 up from 40%. He remained at a 10 of peep with  SPO2 at 89-100%. A rectal pouch was placed externally as his stool was thick black liquid with bright red edges. The rectal pouch would not seal completely as the stool continued to ooze. Whitney NP instructed me to not turn the patient, stop tube feeds (per MD Byrum). 2 units of RBC's were received and one unit was transfused without complaint or issue. A second unit was given to EMS to start at 1541 to infuse while pt was transferred to [facility]. I called report to Jodi Mourning RN Charge Nurse and reported Mr. Spiegelman's status. [facility] transferred Mr. Thone at 1540. After calling report I called Alford Highland to let her know her husbands status and that he was being transferred. She was in agreement with the transfer. I also gave her the unit phone number and the room her husband was going to. I took time with Liborio Nixon on the phone to allow her to ask questions and get answers that were available to me. She had not further questions and we discussed prayers before the conversation ended. Mr. Padin care was completely transferred to [facility].

## 2019-03-24 NOTE — Progress Notes (Signed)
Evening quick bedside rounds  Patient is not actively bleeding.  He is off pressors.  He has some maroon stool through his Flexi-Seal tube.  Nurses have not lavaged him as yet.  According to the note and discussing with the apps the plan is that if there is active upper GI bleed on Lovenox GI should be called to the bedside.  If the lower GI bleed returns then we should get a nuclear medicine scan.  Currently is on 100% oxygen therapy and sedated well.  Plan -NG lavage for upper GI bleed -Continue checking his hemodynamics per unit protocol and hemoglobin every 4 hours     SIGNATURE    Dr. Kalman Shan, M.D., F.C.C.P,  Pulmonary and Critical Care Medicine Staff Physician, [facility] Center Director - Interstitial Lung Disease  Program  Pulmonary Fibrosis [facility] at [facility] [place], [place], [address]  Pager: [phone], If no answer or between  15:00h - 7:00h: call [phone] Telephone: [phone]  7:46 PM 03/24/2019

## 2019-03-24 NOTE — Progress Notes (Signed)
eLink Physician-Brief Progress Note Patient Name: Marc Young DOB: 05-02-1945 MRN: [id number]   Date of Service  03/24/2019  HPI/Events of Note  Hypotension BP = 82/54. CVP = 4.   eICU Interventions  Will order: 1. Bolus with 0.9 NaCl 1 liter IV over 1 hour now.  2. Phenylephrine IV infusion. Titrate to MAP >= 65.     Intervention Category Major Interventions: Hypotension - evaluation and management  Phinneas Shakoor Eugene 03/24/2019, 6:01 AM

## 2019-03-24 NOTE — Progress Notes (Signed)
eLink Physician-Brief Progress Note Patient Name: Marc Young DOB: 07-03-1945 MRN: [id number]   Date of Service  03/24/2019  HPI/Events of Note  Hyperglycemia - Blood glucose = 309.  eICU Interventions  Will order: 1. Insulin IV infusion. 2. D/C Levemir insulin. 3. D/C Novolog SSI.      Intervention Category Major Interventions: Hyperglycemia - active titration of insulin therapy  Longino Trefz Eugene 03/24/2019, 2:24 AM

## 2019-03-25 ENCOUNTER — Encounter (HOSPITAL_COMMUNITY): Payer: Self-pay | Admitting: Certified Registered"

## 2019-03-25 ENCOUNTER — Encounter (HOSPITAL_COMMUNITY): Admission: EM | Disposition: E | Payer: Self-pay | Source: Home / Self Care | Attending: Pulmonary Disease

## 2019-03-25 ENCOUNTER — Encounter (HOSPITAL_COMMUNITY): Payer: Self-pay | Admitting: Internal Medicine

## 2019-03-25 HISTORY — PX: ESOPHAGOGASTRODUODENOSCOPY (EGD) WITH PROPOFOL: SHX5813

## 2019-03-25 HISTORY — PX: HEMOSTASIS CLIP PLACEMENT: SHX6857

## 2019-03-25 LAB — CULTURE, BLOOD (ROUTINE X 2)
Culture: NO GROWTH
Culture: NO GROWTH
Special Requests: ADEQUATE
Special Requests: ADEQUATE

## 2019-03-25 LAB — POCT I-STAT 7, (LYTES, BLD GAS, ICA,H+H)
Acid-base deficit: 3 mmol/L — ABNORMAL HIGH (ref 0.0–2.0)
Bicarbonate: 21.4 mmol/L (ref 20.0–28.0)
Calcium, Ion: 1.27 mmol/L (ref 1.15–1.40)
HCT: 25 % — ABNORMAL LOW (ref 39.0–52.0)
Hemoglobin: 8.5 g/dL — ABNORMAL LOW (ref 13.0–17.0)
O2 Saturation: 98 %
Patient temperature: 97.8
Potassium: 4 mmol/L (ref 3.5–5.1)
Sodium: 157 mmol/L — ABNORMAL HIGH (ref 135–145)
TCO2: 22 mmol/L (ref 22–32)
pCO2 arterial: 35.8 mmHg (ref 32.0–48.0)
pH, Arterial: 7.382 (ref 7.350–7.450)
pO2, Arterial: 107 mmHg (ref 83.0–108.0)

## 2019-03-25 LAB — TYPE AND SCREEN
ABO/RH(D): O POS
Antibody Screen: NEGATIVE
Unit division: 0
Unit division: 0

## 2019-03-25 LAB — BPAM RBC
Blood Product Expiration Date: 202101312359
Blood Product Expiration Date: 202102012359
ISSUE DATE / TIME: 202101031313
ISSUE DATE / TIME: 202101031313
Unit Type and Rh: 5100
Unit Type and Rh: 5100

## 2019-03-25 LAB — GLUCOSE, CAPILLARY
Glucose-Capillary: 114 mg/dL — ABNORMAL HIGH (ref 70–99)
Glucose-Capillary: 127 mg/dL — ABNORMAL HIGH (ref 70–99)
Glucose-Capillary: 155 mg/dL — ABNORMAL HIGH (ref 70–99)
Glucose-Capillary: 182 mg/dL — ABNORMAL HIGH (ref 70–99)
Glucose-Capillary: 187 mg/dL — ABNORMAL HIGH (ref 70–99)
Glucose-Capillary: 190 mg/dL — ABNORMAL HIGH (ref 70–99)
Glucose-Capillary: 293 mg/dL — ABNORMAL HIGH (ref 70–99)
Glucose-Capillary: 40 mg/dL — CL (ref 70–99)
Glucose-Capillary: 60 mg/dL — ABNORMAL LOW (ref 70–99)
Glucose-Capillary: 70 mg/dL (ref 70–99)
Glucose-Capillary: 71 mg/dL (ref 70–99)
Glucose-Capillary: 78 mg/dL (ref 70–99)
Glucose-Capillary: 86 mg/dL (ref 70–99)
Glucose-Capillary: 87 mg/dL (ref 70–99)
Glucose-Capillary: 87 mg/dL (ref 70–99)
Glucose-Capillary: 90 mg/dL (ref 70–99)
Glucose-Capillary: 90 mg/dL (ref 70–99)
Glucose-Capillary: 90 mg/dL (ref 70–99)
Glucose-Capillary: 91 mg/dL (ref 70–99)
Glucose-Capillary: 95 mg/dL (ref 70–99)
Glucose-Capillary: 97 mg/dL (ref 70–99)

## 2019-03-25 LAB — CBC WITH DIFFERENTIAL/PLATELET
Abs Immature Granulocytes: 0.67 10*3/uL — ABNORMAL HIGH (ref 0.00–0.07)
Basophils Absolute: 0.1 10*3/uL (ref 0.0–0.1)
Basophils Relative: 1 %
Eosinophils Absolute: 0.1 10*3/uL (ref 0.0–0.5)
Eosinophils Relative: 1 %
HCT: 35 % — ABNORMAL LOW (ref 39.0–52.0)
Hemoglobin: 11.4 g/dL — ABNORMAL LOW (ref 13.0–17.0)
Immature Granulocytes: 5 %
Lymphocytes Relative: 12 %
Lymphs Abs: 1.5 10*3/uL (ref 0.7–4.0)
MCH: 30.5 pg (ref 26.0–34.0)
MCHC: 32.6 g/dL (ref 30.0–36.0)
MCV: 93.6 fL (ref 80.0–100.0)
Monocytes Absolute: 0.7 10*3/uL (ref 0.1–1.0)
Monocytes Relative: 5 %
Neutro Abs: 9.5 10*3/uL — ABNORMAL HIGH (ref 1.7–7.7)
Neutrophils Relative %: 76 %
Platelets: 198 10*3/uL (ref 150–400)
RBC: 3.74 MIL/uL — ABNORMAL LOW (ref 4.22–5.81)
RDW: 15.6 % — ABNORMAL HIGH (ref 11.5–15.5)
WBC: 12.5 10*3/uL — ABNORMAL HIGH (ref 4.0–10.5)
nRBC: 1.7 % — ABNORMAL HIGH (ref 0.0–0.2)

## 2019-03-25 LAB — COMPREHENSIVE METABOLIC PANEL
ALT: 27 U/L (ref 0–44)
AST: 20 U/L (ref 15–41)
Albumin: 1.6 g/dL — ABNORMAL LOW (ref 3.5–5.0)
Alkaline Phosphatase: 55 U/L (ref 38–126)
Anion gap: 4 — ABNORMAL LOW (ref 5–15)
BUN: 50 mg/dL — ABNORMAL HIGH (ref 8–23)
CO2: 23 mmol/L (ref 22–32)
Calcium: 7 mg/dL — ABNORMAL LOW (ref 8.9–10.3)
Chloride: 125 mmol/L — ABNORMAL HIGH (ref 98–111)
Creatinine, Ser: 1.21 mg/dL (ref 0.61–1.24)
GFR calc Af Amer: >60 mL/min (ref >60)
GFR calc non Af Amer: 59 mL/min — ABNORMAL LOW (ref >60)
Glucose, Bld: 168 mg/dL — ABNORMAL HIGH (ref 70–99)
Potassium: 3.7 mmol/L (ref 3.5–5.1)
Sodium: 152 mmol/L — ABNORMAL HIGH (ref 135–145)
Total Bilirubin: 0.5 mg/dL (ref 0.3–1.2)
Total Protein: 3.4 g/dL — ABNORMAL LOW (ref 6.5–8.1)

## 2019-03-25 LAB — PHOSPHORUS
Phosphorus: 4 mg/dL (ref 2.5–4.6)
Phosphorus: 2.7 mg/dL (ref 2.5–4.6)

## 2019-03-25 LAB — MAGNESIUM
Magnesium: 2.2 mg/dL (ref 1.7–2.4)
Magnesium: 2.1 mg/dL (ref 1.7–2.4)

## 2019-03-25 LAB — ABO/RH: ABO/RH(D): O POS

## 2019-03-25 LAB — HEMOGLOBIN AND HEMATOCRIT, BLOOD
HCT: 33.1 % — ABNORMAL LOW (ref 39.0–52.0)
HCT: 30.8 % — ABNORMAL LOW (ref 39.0–52.0)
Hemoglobin: 10.9 g/dL — ABNORMAL LOW (ref 13.0–17.0)
Hemoglobin: 9.9 g/dL — ABNORMAL LOW (ref 13.0–17.0)

## 2019-03-25 LAB — D-DIMER, QUANTITATIVE: D-Dimer, Quant: 10.35 ug/mL-FEU — ABNORMAL HIGH (ref 0.00–0.50)

## 2019-03-25 LAB — FERRITIN: Ferritin: 325 ng/mL (ref 24–336)

## 2019-03-25 LAB — C-REACTIVE PROTEIN: CRP: 1.9 mg/dL — ABNORMAL HIGH (ref <1.0)

## 2019-03-25 SURGERY — ESOPHAGOGASTRODUODENOSCOPY (EGD) WITH PROPOFOL
Anesthesia: Moderate Sedation

## 2019-03-25 MED ORDER — CHLORHEXIDINE GLUCONATE CLOTH 2 % EX PADS
6.0000 | MEDICATED_PAD | Freq: Every day | CUTANEOUS | Status: DC
  Administered 2019-03-25 – 2019-04-02 (×8): 6 via TOPICAL

## 2019-03-25 MED ORDER — INSULIN ASPART 100 UNIT/ML ~~LOC~~ SOLN
1.0000 [IU] | SUBCUTANEOUS | Status: DC
  Administered 2019-03-26: 1 [IU] via SUBCUTANEOUS
  Administered 2019-03-26: 2 [IU] via SUBCUTANEOUS

## 2019-03-25 MED ORDER — INSULIN DETEMIR 100 UNIT/ML ~~LOC~~ SOLN
6.0000 [IU] | Freq: Every day | SUBCUTANEOUS | Status: DC
  Administered 2019-03-25: 6 [IU] via SUBCUTANEOUS
  Filled 2019-03-25 (×3): qty 0.06

## 2019-03-25 SURGICAL SUPPLY — 15 items

## 2019-03-25 NOTE — Op Note (Signed)
[facility] Patient Name: Marc Young Procedure Date : April 20, 2019 MRN: [id number] Attending MD: Kerin Salen , MD Date of Birth: [birth year] CSN: [id number] Age: 74 Admit Type: Inpatient Procedure:                Upper GI endoscopy Indications:              Hematemesis, Hematochezia Providers:                Kerin Salen, MD, Lanna Poche, Technician,Maggie                            Chrismon RN Referring MD:             Chilton Greathouse, MD/Critical care Medicines:                Monitored Anesthesia Care, Patient was on versed                            and fentanyl drip Complications:            No immediate complications. Estimated Blood Loss:     Estimated blood loss: none. Procedure:                Pre-Anesthesia Assessment:                           - Prior to the procedure, a History and Physical                            was performed, and patient medications and                            allergies were reviewed. The patient's tolerance of                            previous anesthesia was also reviewed. The risks                            and benefits of the procedure and the sedation                            options and risks were discussed with the patient.                            All questions were answered, and informed consent                            was obtained. Prior Anticoagulants: The patient has                            taken no previous anticoagulant or antiplatelet                            agents. ASA Grade Assessment: IV - A patient with  severe systemic disease that is a constant threat                            to life. After reviewing the risks and benefits,                            the patient was deemed in satisfactory condition to                            undergo the procedure.                           After obtaining informed consent, the endoscope was                            passed under  direct vision. Throughout the                            procedure, the patient's blood pressure, pulse, and                            oxygen saturations were monitored continuously. The                            GIF-H190 ([id number]) Olympus gastroscope was                            introduced through the mouth, and advanced to the                            second part of duodenum. The upper GI endoscopy was                            accomplished without difficulty. The patient                            tolerated the procedure well. Scope In: Scope Out: Findings:      The examined esophagus was normal.      The Z-line was regular and was found 38 cm from the incisors.      NG tube was noted in the esophagus with the tip in the duodenum.      One non-bleeding linear and superficial gastric ulcer with a clean ulcer       base (Forrest Class III) was found in the gastric body likely related to       NG tube induced trauma.      One non-bleeding cratered gastric ulcer with a visible vessel was found       at the pylorus. The lesion was 9 mm in largest dimension. For       hemostasis, one hemostatic clip was successfully placed (MR       conditional). There was no bleeding at the end of the procedure.      The examined duodenum was normal.      The cardia and gastric fundus were normal on retroflexion. Although it  was difficult to properly insufflate the stomach with the NG tube in       place. Impression:               - Normal esophagus.                           - Z-line regular, 38 cm from the incisors.                           - Non-bleeding gastric ulcer with a clean ulcer                            base (Forrest Class III).                           - Non-bleeding gastric ulcer with a visible vessel.                            Clip (MR conditional) was placed.                           - Normal examined duodenum.                           - No specimens  collected. Moderate Sedation:      Patient did not receive moderate sedation for this procedure, but       instead received monitored anesthesia care. Recommendation:           - Resume tube feedings today.                           - Use Protonix (pantoprazole) 40 mg PO BID for 2                            months. Procedure Code(s):        --- Professional ---                           [phone], Esophagogastroduodenoscopy, flexible,                            transoral; with control of bleeding, any method Diagnosis Code(s):        --- Professional ---                           K25.9, Gastric ulcer, unspecified as acute or                            chronic, without hemorrhage or perforation                           K25.4, Chronic or unspecified gastric ulcer with                            hemorrhage  K92.0, Hematemesis                           K92.1, Melena (includes Hematochezia) CPT copyright 2019 [employer]. All rights reserved. The codes documented in this report are preliminary and upon coder review may  be revised to meet current compliance requirements. Marc Juniper, MD March 26, 2019 4:56:38 PM This report has been signed electronically. Number of Addenda: 0

## 2019-03-25 NOTE — Progress Notes (Signed)
Called pt's wife with status update. She is most appreciative of Marc Young's care. She is awaiting a phone call from GI regarding consent of the impending EGD.

## 2019-03-25 NOTE — Progress Notes (Signed)
eLink Physician-Brief Progress Note Patient Name: Marc Young DOB: 1945/09/13 MRN: [id number]   Date of Service  04/18/2019  HPI/Events of Note  Patient admitted with COVID pneumonia c/b ARDS for which he is now mechanically ventilated and paralyzed. He subsequently developed a GI bleed for which he underwent EGD with intervention today.  RN calls for 68mm anisocoria in the setting of normal platelets, no overt coagulopathy. He is not on DVT ppx due to GI bleeding.  ABG: 7.38/35/107/21 Drawn on PEEP 10, FiO2 0.6 Sedated on versed/fentanyl. SpO2 99%  eICU Interventions  I do not see any indication for ongoing paralysis at this time. The patient's respiratory status is significantly improved and routine use of paralytics in ARDS in the absence of rescue conditions is not associated with clinical benefit (ROSE Trial).  Discontinue Nimbex drip.  Await Nimbex washout, then RN to lift sedation as tolerated. This will also enable better neuro evaluation, although 35mm anisocoria is generally not pathological especially in presence of NMB.  RT to decrease vent settings per ARDSNet table given improved oxygenation.     Intervention Category Major Interventions: Respiratory failure - evaluation and management Intermediate Interventions: Other:  Janae Bridgeman 04/03/2019, 8:45 PM

## 2019-03-25 NOTE — Progress Notes (Signed)
Inpatient Diabetes Program Recommendations  AACE/ADA: New Consensus Statement on Inpatient Glycemic Control (2015)  Target Ranges:  Prepandial:   less than 140 mg/dL      Peak postprandial:   less than 180 mg/dL (1-2 hours)      Critically ill patients:  140 - 180 mg/dL   Lab Results  Component Value Date   GLUCAP 90 04/08/2019   HGBA1C 9.8 (H) 05-Apr-2019    Review of Glycemic Control Results for AHMED, INNISS (MRN [id number]) as of 04/14/2019 09:50  Ref. Range 03/24/2019 05:02 03/27/2019 06:03 04/17/2019 07:37 03/31/2019 09:07  Glucose-Capillary Latest Ref Range: 70 - 99 mg/dL 86 95 719 (H) 90   Diabetes history: Type 2 DM Outpatient Diabetes medications: Metformin 1000 mg BID, Trulicity 0.75 mg Qwk Current orders for Inpatient glycemic control: IV insulin  Inpatient Diabetes Program Recommendations:    Patient NPO, Decadron has been discontinued. Noted hypoglycemia while on Endotool, assuming related to insulin previously administered.  Could transition patient off IV insulin.  Given NPO and plan for EGD consider: -Levemir 6 units two hours prior to discontinuation, then reassess for BID.  -Novolog 1-3 units Q4H.   Thanks, Lujean Rave, MSN, RNC-OB Diabetes Coordinator [phone] (8a-5p)

## 2019-03-25 NOTE — Progress Notes (Signed)
OT Cancellation Note  Patient Details Name: Prudencio Velazco MRN: [id number] DOB: [birth year]   Cancelled Treatment:    Reason Eval/Treat Not Completed: Medical issues which prohibited therapy.  Pt with medical decline.  OT will sign off.  Please reorder when medically appropriate.  Eber Jones., OTR/L Acute Rehabilitation Services Pager [phone] Office [phone]   Jeani Hawking M 04-06-19, 4:06 PM

## 2019-03-25 NOTE — Progress Notes (Signed)
PT Cancellation Note  Patient Details Name: Marc Young MRN: [id number] DOB: [birth year]   Cancelled Treatment:    Reason Eval/Treat Not Completed: Patient not medically ready. Pt with medical decline since PT order written. Will sign off. Please re-order when appropriate.   Angelina Ok [facility] 04/17/19, 12:59 PM Skip Mayer PT Acute Rehabilitation Services Pager [phone] Office [phone]

## 2019-03-25 NOTE — Op Note (Addendum)
EGD was performed for multiple maroon stools and maroon-colored return on NG tube.  Patient was intubated and on Versed and fentanyl drip, did not require further sedation. Bedside endoscopy was performed.   Findings: NG tube noted in the esophagus through the GE junction into the gastric cavity and the tip was noted in the duodenum.  A superficial linear ulcer was noted in the gastric body, clear related to NG tube induced trauma.  A nonbleeding cratered ulcer with a visible vessel was found at the pylorus, it was 9 mm in dimension, 1 hemostatic clip was placed on the visible vessel, there was no evidence of bleeding at the end of procedure.  Retroflexion was performed to evaluate the cardia and fundus, however the stomach could not be adequately insufflated as the NG tube was in place.   Recommendations: Okay to resume NG tube feeding. PPI twice daily for 2 months. H. pylori serology, plans to treat if positive.  If patient requires anticoagulation, would wait for at least 48 hours, and use anticoagulation if there is no further evidence of GI bleed. Spoke with his wife Marc Young over phone and updated her.  Please recall GI if needed.  Roxana Hires

## 2019-03-25 NOTE — Interval H&P Note (Signed)
History and Physical Interval Note: 73/male with COVID Pneumonia with maroon stool and maroon retunrn on NG tube, intubated for an EGD.  2019/03/27 4:49 PM  Marc Young  has presented today for EGD, with the diagnosis of hemetemesis.  The various methods of treatment have been discussed with the patient and family. After consideration of risks, benefits and other options for treatment, the patient has consented to  Procedure(s): ESOPHAGOGASTRODUODENOSCOPY (EGD) WITH PROPOFOL (N/A) as a surgical intervention.  The patient's history has been reviewed, patient examined, no change in status, stable for surgery.  I have reviewed the patient's chart and labs.  Questions were answered to the patient's satisfaction.     Kerin Salen

## 2019-03-25 NOTE — Progress Notes (Addendum)
NAME:  Marc Young, MRN:  [id number], DOB:  July 03, 1945, LOS: 5 ADMISSION DATE:  02/21/2019, CONSULTATION DATE: 1/1 REFERRING MD: Dr. Randol Kern, CHIEF COMPLAINT: Acute respiratory failure  Brief History   74 year old man with diabetes and hypertension, admitted 12/30 with extensive bilateral COVID-19 pneumonia.  Required intubation 1/1.  History of present illness   74 year old man with history of diabetes and hypertension, viral symptoms beginning about 12/23.  Initial outpatient Covid testing negative but then COVID-19 positive on repeat.  Scheduled for monoclonal antibody infusion 12/30 but found to be significantly hypoxemic on room air.  Admitted 12/30 for further care.  Had been developing worsening cough, body aches, subjective fever.  Chest x-ray 12/30 with extensive multifocal bilateral infiltrates.  Empiric coverage for CAP initiated.  Corticosteroids, remdesivir started, Actemra given x1.  Experienced progressive respiratory distress following admission, ultimately required intubation mechanical ventilation early 1/1.   Past Medical History   has a past medical history of Arthritis, Chronic kidney disease (CKD) stage G1/A1, glomerular filtration rate (GFR) equal to or greater than 90 mL/min/1.73 square meter and albuminuria creatinine ratio less than 30 mg/g, Diabetes mellitus without complication (HCC), Hyperlipidemia, and Hypertension.   Significant Hospital Events   1/1 VDRF 1/3 Transfer to [facility] from [facility] for evaluation of GI bleed  Consults:  PCCM  Procedures:  ETT 1/1 >>  PICC 1/2 >>   Significant Diagnostic Tests:    Micro Data:  SARS CoV2 12/30 >> positive Blood 12/30 >>  C. difficile 12/31 >> negative MRSA screen 1/1 >> negative  Antimicrobials:  Azithromycin 12/31 >>  Ceftriaxone 12/31 >>   Interim history/subjective:  Continues on paralysis.  GI planning to scope later today On Neo-Synephrine which is weaning down.  Objective   Blood  pressure 107/60, pulse 67, temperature 97.7 F (36.5 C), resp. rate (!) 30, height 5\' 3"  (1.6 m), weight 69.3 kg, SpO2 99 %. CVP:  [4 mmHg-14 mmHg] 12 mmHg  Vent Mode: PRVC FiO2 (%):  [70 %-100 %] 70 % Set Rate:  [30 bmp] 30 bmp Vt Set:  [340 mL] 340 mL PEEP:  [10 cmH20] 10 cmH20 Plateau Pressure:  [22 cmH20-24 cmH20] 22 cmH20   Intake/Output Summary (Last 24 hours) at 03/29/2019 1135 Last data filed at 04/16/2019 1100 Gross per 24 hour  Intake 4518.17 ml  Output 1850 ml  Net 2668.17 ml   Filed Weights   03/13/2019 1258 04/08/2019 0500  Weight: 65.8 kg 69.3 kg    Examination: Gen:      No acute distress HEENT:  EOMI, sclera anicteric, ETT Neck:     No masses; no thyromegaly Lungs:    Clear to auscultation bilaterally; normal respiratory effort CV:         Regular rate and rhythm; no murmurs Abd:      + bowel sounds; soft, non-tender; no palpable masses, no distension Ext:    No edema; adequate peripheral perfusion Skin:      Warm and dry; no rash Neuro: Sedated, paralyzed  Resolved Hospital Problem list     Assessment & Plan:  Acute hypoxemic respiratory failure due to COVID-19 pneumonia Possible superimposed bacterial CAP Continue low tidal volume ventilation Continue remdesivir.  Steroids on hold due to GI bleed We will follow ABG and decide if he needs to be prone after his GI bleed is stabilized Continue ceftriaxone, azithromycin  Shock, presumed septic shock with possible superimposed effects of sedating medications Wean off Neo-Synephrine as tolerated  Diarrhea and now evidence for GIB Plan  for EGD later today Follow CBC and transfuse as needed  History of hypertension Continue to hold his amlodipine, carvedilol, doxazosin  Diabetes mellitus Levemir, SSI  Hyperlipidemia Home Pravachol on hold for now  Encephalopathy, need for sedation to facilitate mechanical ventilation Versed, fentanyl   Best practice:  Diet: NPO Pain/Anxiety/Delirium protocol (if  indicated): PAD protocol, plan Versed, fentanyl VAP protocol (if indicated): As ordered DVT prophylaxis: SCDs GI prophylaxis: Pepcid Glucose control: Sliding-scale insulin, Levemir Mobility: Bedrest Code Status: Full code Family Communication: Wife called and update 1/4 Disposition: ICU  Labs   CBC: Recent Labs  Lab 03/21/19 0555 03/22/19 0115 03/23/19 0505 03/24/19 0318 03/24/19 1013 03/24/19 1708 03/24/19 2234 03/24/2019 0305 03/31/2019 1047  WBC 12.3* 16.1* 12.0* 11.4* 16.8*  --   --  12.5*  --   NEUTROABS 11.5* 14.7* 10.4* 10.0*  --   --   --  9.5*  --   HGB 13.9 14.3 11.9* 12.5* 9.3* 13.2 12.1* 11.4* 10.9*  HCT 42.1 43.3 36.6* 38.5* 29.0* 40.6 37.8* 35.0* 33.1*  MCV 90.3 91.7 92.7 93.0 98.0  --   --  93.6  --   PLT 240 273 238 250 300  --   --  198  --     Basic Metabolic Panel: Recent Labs  Lab 03/21/19 0555 03/22/19 0115 03/22/19 0433 03/22/19 1136 03/23/19 0505 03/24/19 0318 03/26/2019 0305  NA 136 142 143 143 144 145 152*  K 3.2* 4.5 4.2 3.9 3.6 3.8 3.7  CL 101 108  --   --  111 113* 125*  CO2 23 24  --   --  22 22 23   GLUCOSE 94 193*  --   --  92 356* 168*  BUN 46* 49*  --   --  59* 50* 50*  CREATININE 1.17 1.10  --   --  1.06 1.04 1.21  CALCIUM 8.6* 9.1  --   --  8.4* 7.8* 7.0*  MG 2.1 2.2  --   --  2.5* 2.3 2.2  PHOS  --   --   --   --   --  3.9 4.0   GFR: Estimated Creatinine Clearance: 47.6 mL/min (by C-G formula based on SCr of 1.21 mg/dL). Recent Labs  Lab 04-06-19 1033 06-Apr-2019 1245 03/21/19 0612 03/22/19 0115 03/23/19 0505 03/24/19 0318 03/24/19 1013 03/23/2019 0305  PROCALCITON 0.68  --  11.51 0.70 0.35 0.11  --   --   WBC 14.5*  --   --  16.1* 12.0* 11.4* 16.8* 12.5*  LATICACIDVEN 2.0* 1.4  --  1.4  --   --   --   --     Liver Function Tests: Recent Labs  Lab 03/21/19 0555 03/22/19 0115 03/23/19 0505 03/24/19 0318 04/03/2019 0305  AST 43* 52* 31 28 20   ALT 38 53* 40 38 27  ALKPHOS 86 103 89 83 55  BILITOT 0.6 0.3 0.8 0.5 0.5    PROT 6.7 7.1 5.2* 4.9* 3.4*  ALBUMIN 3.2* 3.1* 2.6* 2.2* 1.6*   No results for input(s): LIPASE, AMYLASE in the last 168 hours. No results for input(s): AMMONIA in the last 168 hours.  ABG    Component Value Date/Time   PHART 7.328 (L) 03/22/2019 1136   PCO2ART 44.1 03/22/2019 1136   PO2ART 154.0 (H) 03/22/2019 1136   HCO3 23.2 03/22/2019 1136   TCO2 25 03/22/2019 1136   ACIDBASEDEF 3.0 (H) 03/22/2019 1136   O2SAT 99.0 03/22/2019 1136     Coagulation Profile: Recent Labs  Lab 03/24/19 1847  INR 1.4*    Cardiac Enzymes: No results for input(s): CKTOTAL, CKMB, CKMBINDEX, TROPONINI in the last 168 hours.  HbA1C: Hgb A1c MFr Bld  Date/Time Value Ref Range Status  02/21/2019 10:33 AM 9.8 (H) 4.8 - 5.6 % Final    Comment:    (NOTE) Pre diabetes:          5.7%-6.4% Diabetes:              >6.4% Glycemic control for   <7.0% adults with diabetes     CBG: Recent Labs  Lab 04/21/2019 0502 Apr 21, 2019 0603 04-21-2019 0737 2019-04-21 0907 2019/04/21 1118  GLUCAP 86 95 114* 90 78     Critical care time: 45 min    The patient is critically ill with multiple organ system failure and requires high complexity decision making for assessment and support, frequent evaluation and titration of therapies, advanced monitoring, review of radiographic studies and interpretation of complex data.   Critical Care Time devoted to patient care services, exclusive of separately billable procedures, described in this note is 35 minutes.   Chilton Greathouse MD [facility] Please see [email or web address] for pager details.  04-21-2019, 11:39 AM

## 2019-03-25 NOTE — H&P (View-Only) (Signed)
Marc Young  Referring Provider: Marshell Garfinkel, MD/[facility] team Primary Care Physician:  Maris Berger, MD Primary Gastroenterologist: Althia Forts  Reason for Consultation:    HPI: Marc Young is a 74 y.o. male was at the [facility] with Covid pneumonia requiring intubation and was transferred to [facility] with an episode of bright red blood per rectum. Most of the history is obtained from documentation, patient's nurse and his wife over the phone. Patient takes aspirin 81 mg at home, and has been on Lovenox during his hospitalization.  His last colonoscopy was about 5 years ago in [place], was unremarkable as per his wife. No prior endoscopy. There is no history of chronic liver disease.  As per his nurse, he had one episode of maroon but formed stool overnight after transfer to [facility]. The NG tube return had been brown/bile colored and changed to maroon-colored return overnight, has had 150 cc so far. At [place] he was found to be hypotensive and was started on Neo drip.  He had 3 episodes of grossly bloody liquid stool and was found to have a drop of 3 units(hemoglobin from 12-9). Rectal tube was inserted with thick black liquid stool and bright red edges noted.  His tube feedings were eventually stopped. Currently patient is intubated and sedated and has an NG tube, remains n.p.o.. He is on Protonix 40 mg IV twice daily.   As per his wife he had an outpatient Covid testing performed, which was positive, was supposed to get monoclonal antibodies infusion at [facility] on 03/18/2019, however was found to be hypoxic, was transferred to [facility], where x-ray was performed which showed multilobar pneumonia, and was eventually admitted at [facility].  Past Medical History:  Diagnosis Date  . Arthritis   . Chronic kidney disease (CKD) stage G1/A1, glomerular filtration rate (GFR) equal to or greater than 90  mL/min/1.73 square meter and albuminuria creatinine ratio less than 30 mg/g   . Diabetes mellitus without complication ([place])   . Hyperlipidemia   . Hypertension     History reviewed. No pertinent surgical history.  Prior to Admission medications   Medication Sig Start Date End Date Taking? Authorizing Provider  amLODipine (NORVASC) 10 MG tablet Take 10 mg by mouth daily. 02/16/19  Yes [provider]  aspirin 81 MG EC tablet Take 81 mg by mouth daily.   Yes [provider]  benzonatate (TESSALON) 200 MG capsule Take 200 mg by mouth every 8 (eight) hours as needed for cough.   Yes [provider]  carvedilol (COREG) 25 MG tablet Take 25 mg by mouth 2 (two) times daily. 12/31/18  Yes [provider]  doxazosin (CARDURA) 1 MG tablet Take 1 mg by mouth daily. 03/17/19  Yes [provider]  doxycycline (VIBRA-TABS) 100 MG tablet Take 100 mg by mouth 2 (two) times daily. 03/18/19  Yes [provider]  metFORMIN (GLUCOPHAGE-XR) 500 MG 24 hr tablet Take 1,000 mg by mouth 2 (two) times daily. 01/19/19  Yes [provider]  Multiple Vitamin (MULTI-VITAMIN) tablet Take 1 tablet by mouth daily.   Yes [provider]  pravastatin (PRAVACHOL) 80 MG tablet Take 80 mg by mouth at bedtime. 01/11/19  Yes [provider]  PULMICORT FLEXHALER 180 MCG/ACT inhaler Inhale 2 puffs into the lungs 2 (two) times daily. 03/18/19  Yes [provider]  TRULICITY 4.03 KV/4.2VZ SOPN Inject 0.75 mg into the skin once a week. 03/13/19  Yes  [provider]  valsartan (DIOVAN) 160 MG tablet Take 160 mg by mouth daily. 01/11/19  Yes [provider]    Current Facility-Administered Medications  Medication Dose Route Frequency Provider Last Rate Last Admin  . 0.9 %  sodium chloride infusion   Intravenous Continuous Collene Gobble, MD 75 mL/hr at 03/24/19 2200 Rate Verify at 03/24/19 2200  . acetaminophen (TYLENOL) tablet  650 mg  650 mg Oral Q6H PRN Reubin Milan, MD   650 mg at 03/04/2019 1936  . artificial tears (LACRILUBE) ophthalmic ointment 1 application  1 application Both Eyes I2M Byrum, Rose Fillers, MD   1 application at 41/58/30 0506  . ascorbic acid (VITAMIN C) tablet 500 mg  500 mg Per NG tube Daily Steenwyk, Yujing Z, RPH   500 mg at 03/24/19 1031  . azithromycin (ZITHROMAX) 500 mg in sodium chloride 0.9 % 250 mL IVPB  500 mg Intravenous Q24H Reubin Milan, MD 250 mL/hr at 03/30/2019 0800 Rate Verify at 04/01/2019 0800  . cefTRIAXone (ROCEPHIN) 2 g in sodium chloride 0.9 % 100 mL IVPB  2 g Intravenous Q24H Reubin Milan, MD 200 mL/hr at 03/24/19 0903 2 g at 03/24/19 0903  . chlorhexidine gluconate (MEDLINE KIT) (PERIDEX) 0.12 % solution 15 mL  15 mL Mouth Rinse BID Elgergawy, Silver Huguenin, MD   15 mL at 03/22/2019 0742  . Chlorhexidine Gluconate Cloth 2 % PADS 6 each  6 each Topical Daily Elgergawy, Silver Huguenin, MD   6 each at 03/23/2019 0200  . cisatracurium (NIMBEX) 200 mg in sodium chloride 0.9 % 200 mL (1 mg/mL) infusion  0-10 mcg/kg/min Intravenous Titrated Collene Gobble, MD 3.95 mL/hr at 04/04/2019 0800 1 mcg/kg/min at 04/21/2019 0800  . dextrose 5 %-0.45 % sodium chloride infusion   Intravenous Continuous Collene Gobble, MD 75 mL/hr at 04/04/2019 0800 Rate Verify at 03/26/2019 0800  . dextrose 50 % solution 0-50 mL  0-50 mL Intravenous PRN Collene Gobble, MD   30 mL at 04/03/2019 0256  . feeding supplement (VITAL AF 1.2 CAL) liquid 1,000 mL  1,000 mL Per Tube Continuous Collene Gobble, MD   Stopped at 03/24/19 1717  . fentaNYL (SUBLIMAZE) bolus via infusion 25 mcg  25 mcg Intravenous Q15 min PRN Collene Gobble, MD      . fentaNYL 2589mg in NS 2569m(1042mml) infusion-PREMIX  25-300 mcg/hr Intravenous Continuous ByrCollene GobbleD 20 mL/hr at 03/27/2019 0800 200 mcg/hr at 04/09/2019 0800  . guaiFENesin-dextromethorphan (ROBITUSSIN DM) 100-10 MG/5ML syrup 10 mL  10 mL Oral Q4H PRN OrtReubin MilanD   10  mL at 03/21/19 1512  . insulin regular, human (MYXREDLIN) 100 units/ 100 mL infusion   Intravenous Continuous ByrCollene GobbleD 0.2 mL/hr at 03/27/2019 0800 Rate Verify at 04/04/2019 0800  . ipratropium-albuterol (DUONEB) 0.5-2.5 (3) MG/3ML nebulizer solution 3 mL  3 mL Nebulization Q6H PRN ByrCollene GobbleD      . MEDLINE mouth rinse  15 mL Mouth Rinse 10 times per day Elgergawy, DawSilver HugueninD   15 mL at 03/24/2019 0506  . midazolam (VERSED) 50 mg/50 mL (1 mg/mL) premix infusion  2-10 mg/hr Intravenous Continuous ByrCollene GobbleD 7 mL/hr at 04/05/2019 0800 7 mg/hr at 04/07/2019 0800  . midazolam (VERSED) bolus via infusion 1-2 mg  1-2 mg Intravenous Q2H PRN ByrCollene GobbleD      . pantoprazole (PROTONIX) injection 40 mg  40 mg Intravenous Q12H Byrum,  Rose Fillers, MD   40 mg at 03/24/19 2230  . phenylephrine (NEOSYNEPHRINE) 10-0.9 MG/250ML-% infusion  0-400 mcg/min Intravenous Titrated Anders Simmonds, MD 75 mL/hr at 04/04/2019 0800 50 mcg/min at 04/08/2019 0800  . pravastatin (PRAVACHOL) tablet 80 mg  80 mg Per NG tube QHS Steenwyk, Yujing Z, RPH   80 mg at 03/23/19 2219  . senna-docusate (Senokot-S) tablet 1 tablet  1 tablet Per NG tube QHS PRN Steenwyk, Yujing Z, RPH      . sodium chloride flush (NS) 0.9 % injection 10-40 mL  10-40 mL Intracatheter Q12H Collene Gobble, MD   10 mL at 03/24/19 2124  . sodium chloride flush (NS) 0.9 % injection 10-40 mL  10-40 mL Intracatheter PRN Collene Gobble, MD      . zinc sulfate capsule 220 mg  220 mg Per Tube Daily Steenwyk, Yujing Z, RPH   220 mg at 03/24/19 1031    Allergies as of 03/02/2019 - Review Complete 03/05/2019  Allergen Reaction Noted  . Benazepril Cough 05/20/2015  . Hydrochlorothiazide Other (See Comments) 03/02/2018  . Lisinopril Cough 05/20/2015  . Losartan potassium Cough 05/20/2015    History reviewed. No pertinent family history.  Social History   Socioeconomic History  . Marital status: Married    Spouse name: Not on file  .  Number of children: Not on file  . Years of education: Not on file  . Highest education level: Not on file  Occupational History  . Not on file  Tobacco Use  . Smoking status: Never Smoker  . Smokeless tobacco: Never Used  Substance and Sexual Activity  . Alcohol use: Never  . Drug use: Never  . Sexual activity: Not on file  Other Topics Concern  . Not on file  Social History Narrative  . Not on file   Social Determinants of Health   Financial Resource Strain:   . Difficulty of Paying Living Expenses: Not on file  Food Insecurity:   . Worried About Charity fundraiser in the Last Year: Not on file  . Ran Out of Food in the Last Year: Not on file  Transportation Needs:   . Lack of Transportation (Medical): Not on file  . Lack of Transportation (Non-Medical): Not on file  Physical Activity:   . Days of Exercise per Week: Not on file  . Minutes of Exercise per Session: Not on file  Stress:   . Feeling of Stress : Not on file  Social Connections:   . Frequency of Communication with Friends and Family: Not on file  . Frequency of Social Gatherings with Friends and Family: Not on file  . Attends Religious Services: Not on file  . Active Member of Clubs or Organizations: Not on file  . Attends Archivist Meetings: Not on file  . Marital Status: Not on file  Intimate Partner Violence:   . Fear of Current or Ex-Partner: Not on file  . Emotionally Abused: Not on file  . Physically Abused: Not on file  . Sexually Abused: Not on file    Review of Systems: Positive for: GI: Described in detail in HPI.    Gen: anorexia, fatigue, weakness, malaise,Denies any fever, chills, rigors, night sweats,  involuntary weight loss, and sleep disorder CV: Denies chest pain, angina, palpitations, syncope, orthopnea, PND, peripheral edema, and claudication. Resp:dyspnea, Denies  cough, sputum, wheezing, coughing up blood. GU : Denies urinary burning, blood in urine, urinary frequency,  urinary hesitancy, nocturnal urination, and  urinary incontinence. MS: Denies joint pain or swelling.  Denies muscle weakness, cramps, atrophy.  Derm: Denies rash, itching, oral ulcerations, hives, unhealing ulcers.  Psych: Denies depression, anxiety, memory loss, suicidal ideation, hallucinations,  and confusion. Heme: Denies bruising, bleeding, and enlarged lymph nodes. Neuro:  Denies any headaches, dizziness, paresthesias. Endo:  DM, Denies any problems with , thyroid, adrenal function.  Physical Exam: Vital signs in last 24 hours: Temp:  [96.2 F (35.7 C)-98.9 F (37.2 C)] 97.2 F (36.2 C) (01/04 0741) Pulse Rate:  [48-93] 63 (01/04 0815) Resp:  [0-39] 0 (01/04 0815) BP: (65-162)/(42-83) 99/56 (01/04 0815) SpO2:  [80 %-100 %] 100 % (01/04 0815) FiO2 (%):  [40 %-100 %] 80 % (01/04 0400) Weight:  [69.3 kg] 69.3 kg (01/04 0500) Last BM Date: 03/24/18  Patient was not seen or examined due to Covid positive multilobar pneumonia.    Lab Results: Recent Labs    03/24/19 0318 03/24/19 1013 03/24/19 1708 03/24/19 2234 04/08/2019 0305  WBC 11.4* 16.8*  --   --  12.5*  HGB 12.5* 9.3* 13.2 12.1* 11.4*  HCT 38.5* 29.0* 40.6 37.8* 35.0*  PLT 250 300  --   --  198   BMET Recent Labs    03/23/19 0505 03/24/19 0318 04/03/2019 0305  NA 144 145 152*  K 3.6 3.8 3.7  CL 111 113* 125*  CO2 _0 GLUCOSE 92 356* 168*  BUN 59* 50* 50*  CREATININE 1.06 1.04 1.21  CALCIUM 8.4* 7.8* 7.0*   LFT Recent Labs    04/17/2019 0305  PROT 3.4*  ALBUMIN 1.6*  AST 20  ALT 27  ALKPHOS 55  BILITOT 0.5   PT/INR Recent Labs    03/24/19 1847  LABPROT 16.7*  INR 1.4*    Studies/Results: DG CHEST PORT 1 VIEW  Result Date: 03/24/2019 CLINICAL DATA:  COVID-19 positivity, follow-up exam EXAM: PORTABLE CHEST 1 VIEW COMPARISON:  03/24/2019 FINDINGS: Cardiac shadow is stable. Endotracheal tube, nasogastric catheter and right-sided PICC line are again noted. The endotracheal tube is been  withdrawn somewhat and now lies 7.2 cm above the carina. PICC line tip is again noted in the mid right atrium. Patchy opacities are again seen bilaterally consistent with the patient's given clinical history of COVID-19 positivity. IMPRESSION: Stable opacities consistent with the given clinical history. Tubes and lines as described. Electronically Signed   By: Inez Catalina M.D.   On: 03/24/2019 17:26   DG Chest Port 1 View  Result Date: 03/24/2019 CLINICAL DATA:  Acute respiratory failure with hypoxia. EXAM: PORTABLE CHEST 1 VIEW COMPARISON:  Radiograph yesterday. FINDINGS: Endotracheal tube tip 2.9 cm from the carina. Enteric tube tip below the diaphragm not included in the field of view. Upper extremity PICC tip remains in place. Worsening airspace disease throughout both lungs with hazy opacity at the bases suspicious for developing pleural effusions. No pneumothorax. IMPRESSION: 1. Worsening airspace disease throughout both lungs. Increasing hazy opacity at the bases suspicious for developing pleural effusions. 2. Stable support apparatus. Electronically Signed   By: Keith Rake M.D.   On: 03/24/2019 06:09    Impression: Maroon bowel movement, maroon return on NG tube, hypotension and drop in blood count, suggestive of an upper GI bleed Most recent blood pressure 100/56 mmHg, heart rate 67/min  Acute hypoxemic respiratory failure due to COVID-19 pneumonia, possible superimposed bacterial community-acquired pneumonia Presumed septic shock Diarrhea History of diabetes, dyslipidemia, hypertension   Plan: Plan diagnostic endoscopy today. Discussed in details about the risk  and benefits of the procedure with patient's wife Kingston Shawgo over the phone.  She verbalized understanding and consents. Patient is currently n.p.o., on Protonix 40 mg IV every 12 hours.   LOS: 5 days   Ronnette Juniper, MD  03/28/2019, 8:27 AM

## 2019-03-25 NOTE — Brief Op Note (Signed)
02/24/2019 - 03/24/2019  4:56 PM  PATIENT:  Marc Young  74 y.o. male  PRE-OPERATIVE DIAGNOSIS:  hemetemesis  POST-OPERATIVE DIAGNOSIS:  2 ulcers - clip placed on 1  PROCEDURE:  Procedure(s): ESOPHAGOGASTRODUODENOSCOPY (EGD) WITH PROPOFOL (N/A) HEMOSTASIS CLIP PLACEMENT  SURGEON:  Surgeon(s) and Role:    Ronnette Juniper, MD - Primary  PHYSICIAN ASSISTANT:   ASSISTANTS: Maggie Chrismon,RN,Justin Carter,Tech  ANESTHESIA:   MAC  EBL:  None  BLOOD ADMINISTERED:none  DRAINS: none   LOCAL MEDICATIONS USED:  NONE  SPECIMEN:  No Specimen  DISPOSITION OF SPECIMEN:  N/A  COUNTS:  YES  TOURNIQUET:  * No tourniquets in log *  DICTATION: .Dragon Dictation  PLAN OF CARE: Admit to inpatient   PATIENT DISPOSITION:  PACU - hemodynamically stable.   Delay start of Pharmacological VTE agent (>24hrs) due to surgical blood loss or risk of bleeding: not applicable

## 2019-03-25 NOTE — Consult Note (Signed)
[facility] Consult  Referring Provider: Mannam,Praveen, MD/[facility] team Primary Care Physician:  Whyte, Thomas M, MD Primary Gastroenterologist: Unassigned  Reason for Consultation:    HPI: Marc Young is a 73 y.o. male was at the [facility] with Covid pneumonia requiring intubation and was transferred to [place] with an episode of bright red blood per rectum. Most of the history is obtained from documentation, patient's nurse and his wife over the phone. Patient takes aspirin 81 mg at home, and has been on Lovenox during his hospitalization.  His last colonoscopy was about 5 years ago in [place], was unremarkable as per his wife. No prior endoscopy. There is no history of chronic liver disease.  As per his nurse, he had one episode of maroon but formed stool overnight after transfer to [place]. The NG tube return had been brown/bile colored and changed to maroon-colored return overnight, has had 150 cc so far. At [facility] he was found to be hypotensive and was started on Neo drip.  He had 3 episodes of grossly bloody liquid stool and was found to have a drop of 3 units(hemoglobin from 12-9). Rectal tube was inserted with thick black liquid stool and bright red edges noted.  His tube feedings were eventually stopped. Currently patient is intubated and sedated and has an NG tube, remains n.p.o.. He is on Protonix 40 mg IV twice daily.   As per his wife he had an outpatient Covid testing performed, which was positive, was supposed to get monoclonal antibodies infusion at [facility] on 03/18/2019, however was found to be hypoxic, was transferred to [place], where x-ray was performed which showed multilobar pneumonia, and was eventually admitted at [facility].  Past Medical History:  Diagnosis Date  . Arthritis   . Chronic kidney disease (CKD) stage G1/A1, glomerular filtration rate (GFR) equal to or greater than 90  mL/min/1.73 square meter and albuminuria creatinine ratio less than 30 mg/g   . Diabetes mellitus without complication (HCC)   . Hyperlipidemia   . Hypertension     History reviewed. No pertinent surgical history.  Prior to Admission medications   Medication Sig Start Date End Date Taking? Authorizing Provider  amLODipine (NORVASC) 10 MG tablet Take 10 mg by mouth daily. 02/16/19  Yes [provider]  aspirin 81 MG EC tablet Take 81 mg by mouth daily.   Yes [provider]  benzonatate (TESSALON) 200 MG capsule Take 200 mg by mouth every 8 (eight) hours as needed for cough.   Yes [provider]  carvedilol (COREG) 25 MG tablet Take 25 mg by mouth 2 (two) times daily. 12/31/18  Yes [provider]  doxazosin (CARDURA) 1 MG tablet Take 1 mg by mouth daily. 03/17/19  Yes [provider]  doxycycline (VIBRA-TABS) 100 MG tablet Take 100 mg by mouth 2 (two) times daily. 03/18/19  Yes [provider]  metFORMIN (GLUCOPHAGE-XR) 500 MG 24 hr tablet Take 1,000 mg by mouth 2 (two) times daily. 01/19/19  Yes [provider]  Multiple Vitamin (MULTI-VITAMIN) tablet Take 1 tablet by mouth daily.   Yes [provider]  pravastatin (PRAVACHOL) 80 MG tablet Take 80 mg by mouth at bedtime. 01/11/19  Yes [provider]  PULMICORT FLEXHALER 180 MCG/ACT inhaler Inhale 2 puffs into the lungs 2 (two) times daily. 03/18/19  Yes [provider]  TRULICITY 0.75 MG/0.5ML SOPN Inject 0.75 mg into the skin once a week. 03/13/19  Yes   [provider]  valsartan (DIOVAN) 160 MG tablet Take 160 mg by mouth daily. 01/11/19  Yes [provider]    Current Facility-Administered Medications  Medication Dose Route Frequency Provider Last Rate Last Admin  . 0.9 %  sodium chloride infusion   Intravenous Continuous Byrum, Robert S, MD 75 mL/hr at 03/24/19 2200 Rate Verify at 03/24/19 2200  . acetaminophen (TYLENOL) tablet  650 mg  650 mg Oral Q6H PRN Ortiz, David Manuel, MD   650 mg at 03/05/2019 1936  . artificial tears (LACRILUBE) ophthalmic ointment 1 application  1 application Both Eyes Q8H Byrum, Robert S, MD   1 application at 04/12/2019 0506  . ascorbic acid (VITAMIN C) tablet 500 mg  500 mg Per NG tube Daily Steenwyk, Yujing Z, RPH   500 mg at 03/24/19 1031  . azithromycin (ZITHROMAX) 500 mg in sodium chloride 0.9 % 250 mL IVPB  500 mg Intravenous Q24H Ortiz, David Manuel, MD 250 mL/hr at 04/21/2019 0800 Rate Verify at 04/17/2019 0800  . cefTRIAXone (ROCEPHIN) 2 g in sodium chloride 0.9 % 100 mL IVPB  2 g Intravenous Q24H Ortiz, David Manuel, MD 200 mL/hr at 03/24/19 0903 2 g at 03/24/19 0903  . chlorhexidine gluconate (MEDLINE KIT) (PERIDEX) 0.12 % solution 15 mL  15 mL Mouth Rinse BID Elgergawy, Dawood S, MD   15 mL at 04/19/2019 0742  . Chlorhexidine Gluconate Cloth 2 % PADS 6 each  6 each Topical Daily Elgergawy, Dawood S, MD   6 each at 04/08/2019 0200  . cisatracurium (NIMBEX) 200 mg in sodium chloride 0.9 % 200 mL (1 mg/mL) infusion  0-10 mcg/kg/min Intravenous Titrated Byrum, Robert S, MD 3.95 mL/hr at 04/04/2019 0800 1 mcg/kg/min at 04/16/2019 0800  . dextrose 5 %-0.45 % sodium chloride infusion   Intravenous Continuous Byrum, Robert S, MD 75 mL/hr at 04/08/2019 0800 Rate Verify at 04/05/2019 0800  . dextrose 50 % solution 0-50 mL  0-50 mL Intravenous PRN Byrum, Robert S, MD   30 mL at 04/08/2019 0256  . feeding supplement (VITAL AF 1.2 CAL) liquid 1,000 mL  1,000 mL Per Tube Continuous Byrum, Robert S, MD   Stopped at 03/24/19 1717  . fentaNYL (SUBLIMAZE) bolus via infusion 25 mcg  25 mcg Intravenous Q15 min PRN Byrum, Robert S, MD      . fentaNYL 2500mcg in NS 250mL (10mcg/ml) infusion-PREMIX  25-300 mcg/hr Intravenous Continuous Byrum, Robert S, MD 20 mL/hr at 04/01/2019 0800 200 mcg/hr at 04/07/2019 0800  . guaiFENesin-dextromethorphan (ROBITUSSIN DM) 100-10 MG/5ML syrup 10 mL  10 mL Oral Q4H PRN Ortiz, David Manuel, MD   10  mL at 03/21/19 1512  . insulin regular, human (MYXREDLIN) 100 units/ 100 mL infusion   Intravenous Continuous Byrum, Robert S, MD 0.2 mL/hr at 03/30/2019 0800 Rate Verify at 04/02/2019 0800  . ipratropium-albuterol (DUONEB) 0.5-2.5 (3) MG/3ML nebulizer solution 3 mL  3 mL Nebulization Q6H PRN Byrum, Robert S, MD      . MEDLINE mouth rinse  15 mL Mouth Rinse 10 times per day Elgergawy, Dawood S, MD   15 mL at 03/26/2019 0506  . midazolam (VERSED) 50 mg/50 mL (1 mg/mL) premix infusion  2-10 mg/hr Intravenous Continuous Byrum, Robert S, MD 7 mL/hr at 04/16/2019 0800 7 mg/hr at 04/11/2019 0800  . midazolam (VERSED) bolus via infusion 1-2 mg  1-2 mg Intravenous Q2H PRN Byrum, Robert S, MD      . pantoprazole (PROTONIX) injection 40 mg  40 mg Intravenous Q12H Byrum,   Robert S, MD   40 mg at 03/24/19 2230  . phenylephrine (NEOSYNEPHRINE) 10-0.9 MG/250ML-% infusion  0-400 mcg/min Intravenous Titrated Sommer, Steven E, MD 75 mL/hr at 04/09/2019 0800 50 mcg/min at 03/26/2019 0800  . pravastatin (PRAVACHOL) tablet 80 mg  80 mg Per NG tube QHS Steenwyk, Yujing Z, RPH   80 mg at 03/23/19 2219  . senna-docusate (Senokot-S) tablet 1 tablet  1 tablet Per NG tube QHS PRN Steenwyk, Yujing Z, RPH      . sodium chloride flush (NS) 0.9 % injection 10-40 mL  10-40 mL Intracatheter Q12H Byrum, Robert S, MD   10 mL at 03/24/19 2124  . sodium chloride flush (NS) 0.9 % injection 10-40 mL  10-40 mL Intracatheter PRN Byrum, Robert S, MD      . zinc sulfate capsule 220 mg  220 mg Per Tube Daily Steenwyk, Yujing Z, RPH   220 mg at 03/24/19 1031    Allergies as of 03/17/2019 - Review Complete 03/07/2019  Allergen Reaction Noted  . Benazepril Cough 05/20/2015  . Hydrochlorothiazide Other (See Comments) 03/02/2018  . Lisinopril Cough 05/20/2015  . Losartan potassium Cough 05/20/2015    History reviewed. No pertinent family history.  Social History   Socioeconomic History  . Marital status: Married    Spouse name: Not on file  .  Number of children: Not on file  . Years of education: Not on file  . Highest education level: Not on file  Occupational History  . Not on file  Tobacco Use  . Smoking status: Never Smoker  . Smokeless tobacco: Never Used  Substance and Sexual Activity  . Alcohol use: Never  . Drug use: Never  . Sexual activity: Not on file  Other Topics Concern  . Not on file  Social History Narrative  . Not on file   Social Determinants of Health   Financial Resource Strain:   . Difficulty of Paying Living Expenses: Not on file  Food Insecurity:   . Worried About Running Out of Food in the Last Year: Not on file  . Ran Out of Food in the Last Year: Not on file  Transportation Needs:   . Lack of Transportation (Medical): Not on file  . Lack of Transportation (Non-Medical): Not on file  Physical Activity:   . Days of Exercise per Week: Not on file  . Minutes of Exercise per Session: Not on file  Stress:   . Feeling of Stress : Not on file  Social Connections:   . Frequency of Communication with Friends and Family: Not on file  . Frequency of Social Gatherings with Friends and Family: Not on file  . Attends Religious Services: Not on file  . Active Member of Clubs or Organizations: Not on file  . Attends Club or Organization Meetings: Not on file  . Marital Status: Not on file  Intimate Partner Violence:   . Fear of Current or Ex-Partner: Not on file  . Emotionally Abused: Not on file  . Physically Abused: Not on file  . Sexually Abused: Not on file    Review of Systems: Positive for: GI: Described in detail in HPI.    Gen: anorexia, fatigue, weakness, malaise,Denies any fever, chills, rigors, night sweats,  involuntary weight loss, and sleep disorder CV: Denies chest pain, angina, palpitations, syncope, orthopnea, PND, peripheral edema, and claudication. Resp:dyspnea, Denies  cough, sputum, wheezing, coughing up blood. GU : Denies urinary burning, blood in urine, urinary frequency,  urinary hesitancy, nocturnal urination, and   urinary incontinence. MS: Denies joint pain or swelling.  Denies muscle weakness, cramps, atrophy.  Derm: Denies rash, itching, oral ulcerations, hives, unhealing ulcers.  Psych: Denies depression, anxiety, memory loss, suicidal ideation, hallucinations,  and confusion. Heme: Denies bruising, bleeding, and enlarged lymph nodes. Neuro:  Denies any headaches, dizziness, paresthesias. Endo:  DM, Denies any problems with , thyroid, adrenal function.  Physical Exam: Vital signs in last 24 hours: Temp:  [96.2 F (35.7 C)-98.9 F (37.2 C)] 97.2 F (36.2 C) (01/04 0741) Pulse Rate:  [48-93] 63 (01/04 0815) Resp:  [0-39] 0 (01/04 0815) BP: (65-162)/(42-83) 99/56 (01/04 0815) SpO2:  [80 %-100 %] 100 % (01/04 0815) FiO2 (%):  [40 %-100 %] 80 % (01/04 0400) Weight:  [69.3 kg] 69.3 kg (01/04 0500) Last BM Date: 03/24/18  Patient was not seen or examined due to Covid positive multilobar pneumonia.    Lab Results: Recent Labs    03/24/19 0318 03/24/19 1013 03/24/19 1708 03/24/19 2234 03/27/2019 0305  WBC 11.4* 16.8*  --   --  12.5*  HGB 12.5* 9.3* 13.2 12.1* 11.4*  HCT 38.5* 29.0* 40.6 37.8* 35.0*  PLT 250 300  --   --  198   BMET Recent Labs    03/23/19 0505 03/24/19 0318 04/08/2019 0305  NA 144 145 152*  K 3.6 3.8 3.7  CL 111 113* 125*  CO2 22 22 23  GLUCOSE 92 356* 168*  BUN 59* 50* 50*  CREATININE 1.06 1.04 1.21  CALCIUM 8.4* 7.8* 7.0*   LFT Recent Labs    04/14/2019 0305  PROT 3.4*  ALBUMIN 1.6*  AST 20  ALT 27  ALKPHOS 55  BILITOT 0.5   PT/INR Recent Labs    03/24/19 1847  LABPROT 16.7*  INR 1.4*    Studies/Results: DG CHEST PORT 1 VIEW  Result Date: 03/24/2019 CLINICAL DATA:  COVID-19 positivity, follow-up exam EXAM: PORTABLE CHEST 1 VIEW COMPARISON:  03/24/2019 FINDINGS: Cardiac shadow is stable. Endotracheal tube, nasogastric catheter and right-sided PICC line are again noted. The endotracheal tube is been  withdrawn somewhat and now lies 7.2 cm above the carina. PICC line tip is again noted in the mid right atrium. Patchy opacities are again seen bilaterally consistent with the patient's given clinical history of COVID-19 positivity. IMPRESSION: Stable opacities consistent with the given clinical history. Tubes and lines as described. Electronically Signed   By: Mark  Lukens M.D.   On: 03/24/2019 17:26   DG Chest Port 1 View  Result Date: 03/24/2019 CLINICAL DATA:  Acute respiratory failure with hypoxia. EXAM: PORTABLE CHEST 1 VIEW COMPARISON:  Radiograph yesterday. FINDINGS: Endotracheal tube tip 2.9 cm from the carina. Enteric tube tip below the diaphragm not included in the field of view. Upper extremity PICC tip remains in place. Worsening airspace disease throughout both lungs with hazy opacity at the bases suspicious for developing pleural effusions. No pneumothorax. IMPRESSION: 1. Worsening airspace disease throughout both lungs. Increasing hazy opacity at the bases suspicious for developing pleural effusions. 2. Stable support apparatus. Electronically Signed   By: Melanie  Sanford M.D.   On: 03/24/2019 06:09    Impression: Maroon bowel movement, maroon return on NG tube, hypotension and drop in blood count, suggestive of an upper GI bleed Most recent blood pressure 100/56 mmHg, heart rate 67/min  Acute hypoxemic respiratory failure due to COVID-19 pneumonia, possible superimposed bacterial community-acquired pneumonia Presumed septic shock Diarrhea History of diabetes, dyslipidemia, hypertension   Plan: Plan diagnostic endoscopy today. Discussed in details about the risk   and benefits of the procedure with patient's wife Marc Young over the phone.  She verbalized understanding and consents. Patient is currently n.p.o., on Protonix 40 mg IV every 12 hours.   LOS: 5 days   Zitlaly Malson, MD  03/23/2019, 8:27 AM  

## 2019-03-26 ENCOUNTER — Inpatient Hospital Stay (HOSPITAL_COMMUNITY): Payer: Medicare Other

## 2019-03-26 LAB — POCT I-STAT 7, (LYTES, BLD GAS, ICA,H+H)
Acid-base deficit: 5 mmol/L — ABNORMAL HIGH (ref 0.0–2.0)
Bicarbonate: 20.3 mmol/L (ref 20.0–28.0)
Calcium, Ion: 1.27 mmol/L (ref 1.15–1.40)
HCT: 31 % — ABNORMAL LOW (ref 39.0–52.0)
Hemoglobin: 10.5 g/dL — ABNORMAL LOW (ref 13.0–17.0)
O2 Saturation: 96 %
Patient temperature: 98.5
Potassium: 3.9 mmol/L (ref 3.5–5.1)
Sodium: 156 mmol/L — ABNORMAL HIGH (ref 135–145)
TCO2: 21 mmol/L — ABNORMAL LOW (ref 22–32)
pCO2 arterial: 37.7 mmHg (ref 32.0–48.0)
pH, Arterial: 7.34 — ABNORMAL LOW (ref 7.350–7.450)
pO2, Arterial: 84 mmHg (ref 83.0–108.0)

## 2019-03-26 LAB — BASIC METABOLIC PANEL
Anion gap: 5 (ref 5–15)
BUN: 33 mg/dL — ABNORMAL HIGH (ref 8–23)
CO2: 22 mmol/L (ref 22–32)
Calcium: 7.5 mg/dL — ABNORMAL LOW (ref 8.9–10.3)
Chloride: 129 mmol/L — ABNORMAL HIGH (ref 98–111)
Creatinine, Ser: 1.03 mg/dL (ref 0.61–1.24)
GFR calc Af Amer: >60 mL/min (ref >60)
GFR calc non Af Amer: >60 mL/min (ref >60)
Glucose, Bld: 110 mg/dL — ABNORMAL HIGH (ref 70–99)
Potassium: 4.1 mmol/L (ref 3.5–5.1)
Sodium: 156 mmol/L — ABNORMAL HIGH (ref 135–145)

## 2019-03-26 LAB — GLUCOSE, CAPILLARY
Glucose-Capillary: 101 mg/dL — ABNORMAL HIGH (ref 70–99)
Glucose-Capillary: 118 mg/dL — ABNORMAL HIGH (ref 70–99)
Glucose-Capillary: 152 mg/dL — ABNORMAL HIGH (ref 70–99)
Glucose-Capillary: 175 mg/dL — ABNORMAL HIGH (ref 70–99)
Glucose-Capillary: 188 mg/dL — ABNORMAL HIGH (ref 70–99)
Glucose-Capillary: 230 mg/dL — ABNORMAL HIGH (ref 70–99)
Glucose-Capillary: 74 mg/dL (ref 70–99)

## 2019-03-26 LAB — CBC
HCT: 33.7 % — ABNORMAL LOW (ref 39.0–52.0)
Hemoglobin: 10.9 g/dL — ABNORMAL LOW (ref 13.0–17.0)
MCH: 30.4 pg (ref 26.0–34.0)
MCHC: 32.3 g/dL (ref 30.0–36.0)
MCV: 93.9 fL (ref 80.0–100.0)
Platelets: 146 10*3/uL — ABNORMAL LOW (ref 150–400)
RBC: 3.59 MIL/uL — ABNORMAL LOW (ref 4.22–5.81)
RDW: 16.2 % — ABNORMAL HIGH (ref 11.5–15.5)
WBC: 10.9 10*3/uL — ABNORMAL HIGH (ref 4.0–10.5)
nRBC: 0.5 % — ABNORMAL HIGH (ref 0.0–0.2)

## 2019-03-26 LAB — MAGNESIUM: Magnesium: 2.2 mg/dL (ref 1.7–2.4)

## 2019-03-26 LAB — HEMOGLOBIN AND HEMATOCRIT, BLOOD
HCT: 34.4 % — ABNORMAL LOW (ref 39.0–52.0)
HCT: 33.4 % — ABNORMAL LOW (ref 39.0–52.0)
HCT: 34.4 % — ABNORMAL LOW (ref 39.0–52.0)
Hemoglobin: 11 g/dL — ABNORMAL LOW (ref 13.0–17.0)
Hemoglobin: 10.6 g/dL — ABNORMAL LOW (ref 13.0–17.0)
Hemoglobin: 10.9 g/dL — ABNORMAL LOW (ref 13.0–17.0)

## 2019-03-26 LAB — PHOSPHORUS: Phosphorus: 3.3 mg/dL (ref 2.5–4.6)

## 2019-03-26 MED ORDER — FUROSEMIDE 10 MG/ML IJ SOLN
40.0000 mg | Freq: Once | INTRAMUSCULAR | Status: AC
  Administered 2019-03-26: 10:00:00 40 mg via INTRAVENOUS

## 2019-03-26 MED ORDER — DEXMEDETOMIDINE HCL IN NACL 200 MCG/50ML IV SOLN: 0.4000 ug/kg/h | INTRAVENOUS | Status: DC

## 2019-03-26 MED ORDER — FENTANYL 2500MCG IN NS 250ML (10MCG/ML) PREMIX INFUSION
25.0000 ug/h | INTRAVENOUS | Status: DC
  Administered 2019-03-26: 150 ug/h via INTRAVENOUS
  Administered 2019-03-27: 07:00:00 300 ug/h via INTRAVENOUS
  Filled 2019-03-26 (×3): qty 250

## 2019-03-26 MED ORDER — FREE WATER
350.0000 mL | Freq: Three times a day (TID) | Status: DC
  Administered 2019-03-26 – 2019-03-27 (×4): 350 mL

## 2019-03-26 MED ORDER — DEXMEDETOMIDINE HCL IN NACL 400 MCG/100ML IV SOLN
0.4000 ug/kg/h | INTRAVENOUS | Status: DC
  Administered 2019-03-26: 1 ug/kg/h via INTRAVENOUS
  Administered 2019-03-26: 0.9 ug/kg/h via INTRAVENOUS
  Administered 2019-03-27: 1.7 ug/kg/h via INTRAVENOUS
  Administered 2019-03-27: 1.2 ug/kg/h via INTRAVENOUS
  Administered 2019-03-27: 1.5 ug/kg/h via INTRAVENOUS
  Administered 2019-03-27: 10:00:00 1.4 ug/kg/h via INTRAVENOUS
  Administered 2019-03-28 (×3): 1.7 ug/kg/h via INTRAVENOUS
  Administered 2019-03-28: 1.5 ug/kg/h via INTRAVENOUS
  Administered 2019-03-28 (×2): 1.7 ug/kg/h via INTRAVENOUS
  Administered 2019-03-28: 1.5 ug/kg/h via INTRAVENOUS
  Administered 2019-03-29: 1.4 ug/kg/h via INTRAVENOUS
  Administered 2019-03-29: 1.5 ug/kg/h via INTRAVENOUS
  Administered 2019-03-29: 1.3 ug/kg/h via INTRAVENOUS
  Administered 2019-03-29 (×4): 1.5 ug/kg/h via INTRAVENOUS
  Administered 2019-03-30 (×2): 0.8 ug/kg/h via INTRAVENOUS
  Administered 2019-03-30: 1.7 ug/kg/h via INTRAVENOUS
  Administered 2019-03-30 – 2019-03-31 (×5): 1.2 ug/kg/h via INTRAVENOUS
  Filled 2019-03-26 (×16): qty 100
  Filled 2019-03-26: qty 200
  Filled 2019-03-26 (×8): qty 100
  Filled 2019-03-26: qty 200
  Filled 2019-03-26 (×3): qty 100

## 2019-03-26 MED ORDER — FENTANYL BOLUS VIA INFUSION
25.0000 ug | INTRAVENOUS | Status: DC | PRN
  Administered 2019-03-26 – 2019-03-27 (×5): 25 ug via INTRAVENOUS
  Filled 2019-03-26: qty 25

## 2019-03-26 MED ORDER — MIDAZOLAM HCL 2 MG/2ML IJ SOLN
1.0000 mg | INTRAMUSCULAR | Status: AC | PRN
  Administered 2019-03-27 (×3): 1 mg via INTRAVENOUS
  Filled 2019-03-26 (×3): qty 2

## 2019-03-26 MED ORDER — FUROSEMIDE 10 MG/ML IJ SOLN
INTRAMUSCULAR | Status: AC
  Filled 2019-03-26: qty 4

## 2019-03-26 MED ORDER — VITAL AF 1.2 CAL PO LIQD
1000.0000 mL | ORAL | Status: DC
  Administered 2019-03-26 – 2019-03-27 (×2): 1000 mL

## 2019-03-26 MED ORDER — INSULIN DETEMIR 100 UNIT/ML ~~LOC~~ SOLN
4.0000 [IU] | Freq: Every day | SUBCUTANEOUS | Status: DC
  Administered 2019-03-26: 4 [IU] via SUBCUTANEOUS
  Filled 2019-03-26 (×2): qty 0.04

## 2019-03-26 MED ORDER — MIDAZOLAM HCL 2 MG/2ML IJ SOLN
1.0000 mg | INTRAMUSCULAR | Status: DC | PRN
  Administered 2019-03-27 – 2019-03-28 (×3): 1 mg via INTRAVENOUS
  Filled 2019-03-26 (×3): qty 2

## 2019-03-26 MED ORDER — INSULIN ASPART 100 UNIT/ML ~~LOC~~ SOLN
0.0000 [IU] | SUBCUTANEOUS | Status: DC
  Administered 2019-03-26: 2 [IU] via SUBCUTANEOUS
  Administered 2019-03-27: 7 [IU] via SUBCUTANEOUS
  Administered 2019-03-27: 3 [IU] via SUBCUTANEOUS
  Administered 2019-03-27: 2 [IU] via SUBCUTANEOUS
  Administered 2019-03-27: 7 [IU] via SUBCUTANEOUS
  Administered 2019-03-27: 3 [IU] via SUBCUTANEOUS
  Administered 2019-03-27 (×2): 5 [IU] via SUBCUTANEOUS
  Administered 2019-03-28 (×3): 2 [IU] via SUBCUTANEOUS
  Administered 2019-03-28: 1 [IU] via SUBCUTANEOUS
  Administered 2019-03-28: 2 [IU] via SUBCUTANEOUS
  Administered 2019-03-28: 1 [IU] via SUBCUTANEOUS
  Administered 2019-03-29: 12:00:00 3 [IU] via SUBCUTANEOUS
  Administered 2019-03-29 (×2): 2 [IU] via SUBCUTANEOUS
  Administered 2019-03-29: 5 [IU] via SUBCUTANEOUS
  Administered 2019-03-30: 9 [IU] via SUBCUTANEOUS
  Administered 2019-03-30: 3 [IU] via SUBCUTANEOUS
  Administered 2019-03-30: 2 [IU] via SUBCUTANEOUS
  Administered 2019-03-30: 5 [IU] via SUBCUTANEOUS
  Administered 2019-03-30: 7 [IU] via SUBCUTANEOUS
  Administered 2019-03-30: 5 [IU] via SUBCUTANEOUS
  Administered 2019-03-31: 7 [IU] via SUBCUTANEOUS
  Administered 2019-03-31: 04:00:00 5 [IU] via SUBCUTANEOUS

## 2019-03-26 NOTE — Progress Notes (Signed)
Nutrition Follow-up  RD working remotely.  DOCUMENTATION CODES:   Not applicable  INTERVENTION:   Tube feeding: - Vital AF 1.2 @ 55 ml/hr (1320 ml/day) via OG tube - Free water per MD  Tube feeding regimen provides 1584 kcal, 99 grams of protein, and 1071 ml of H2O.  NUTRITION DIAGNOSIS:   Increased nutrient needs related to acute illness (COVID-19 infection) as evidenced by estimated needs.  Ongoing, being addressed via TF  GOAL:   Patient will meet greater than or equal to 90% of their needs  Met via TF  MONITOR:   Vent status, Labs, Weight trends, TF tolerance, I & O's  REASON FOR ASSESSMENT:   Consult Enteral/tube feeding initiation and management  ASSESSMENT:   74 year old man with history of diabetes and hypertension, viral symptoms beginning about 12/23.  Initial outpatient Covid testing negative but then COVID-19 positive on repeat.  Scheduled for monoclonal antibody infusion 12/30 but found to be significantly hypoxemic on room air.  Admitted 12/30 for further care.  Had been developing worsening cough, body aches, subjective fever.  Chest x-ray 12/30 with extensive multifocal bilateral infiltrates.  Empiric coverage for CAP initiated.  Corticosteroids, remdesivir started, Actemra given x1.  Experienced progressive respiratory distress following admission, ultimately required intubation mechanical ventilation early 1/1.  12/30 - admitted with bilateral COVID-19 pneumonia 01/01 - intubated 01/03 - TF stopped, transferred to [facility] for evaluation of GI bleed 01/04 - EGD with clip placed on gastric ulcer  RD consulted for tube feeding initiation and management. Okay to resume tube feedings per GI.  MD has ordered free water flushes of 350 ml q 8 hours.  OGT in place.  Per RN edema assessment, pt with mild pitting generalized edema.  Pt with a 4 lb weight gain since admit. Suspect weight gain related to fluid status. EDW: 69.3 kg  Patient is currently  intubated on ventilator support MV: 11.3 L/min Temp (24hrs), Avg:97.9 F (36.6 C), Min:97.5 F (36.4 C), Max:98.5 F (36.9 C) BP (cuff): 91/54 MAP (cuff): 67  Drips: Fentanyl: 15 ml/hr Precedex: 17.8 ml/hr NS: 75 ml/hr  Medications reviewed and include: vitamin C, SSI q 4 hours, Levemir 4 units daily, protonix, zinc sulfate, IV abx  Labs reviewed: sodium 156, chloride 129 CBG's: 40-175 x 24 hours  UOP: 1340 ml x 24 hours OGT: 300 ml x 24 hours I/O's: +2.1 L since admit  NUTRITION - FOCUSED PHYSICAL EXAM:  Unable to complete at this time. RD working remotely.  Diet Order:   Diet Order            Diet NPO time specified  Diet effective now              EDUCATION NEEDS:   No education needs have been identified at this time  Skin:  Skin Assessment: Reviewed RN Assessment  Last BM:  03/24/18  Height:   Ht Readings from Last 1 Encounters:  03/22/19 _0  (1.6 m)    Weight:   Wt Readings from Last 1 Encounters:  03/26/19 71.1 kg    Ideal Body Weight:  56.3 kg  BMI:  Body mass index is 27.77 kg/m.  Estimated Nutritional Needs:   Kcal:  8882 (PSU 2003b)  Protein:  90-110 grams  Fluid:  2L/day    Gaynell Face, MS, RD, LDN Inpatient Clinical Dietitian Pager: [phone] Weekend/After Hours: [phone]

## 2019-03-26 NOTE — Progress Notes (Addendum)
NAME:  Marc Young, MRN:  [id number], DOB:  1946-01-17, LOS: 6 ADMISSION DATE:  03/01/2019, CONSULTATION DATE: 1/1 REFERRING MD: Dr. Randol Kern, CHIEF COMPLAINT: Acute respiratory failure  Brief History   74 year old man with diabetes and hypertension, admitted 12/30 with extensive bilateral COVID-19 pneumonia.  Required intubation 1/1.  History of present illness   74 year old man with history of diabetes and hypertension, viral symptoms beginning about 12/23.  Initial outpatient Covid testing negative but then COVID-19 positive on repeat.  Scheduled for monoclonal antibody infusion 12/30 but found to be significantly hypoxemic on room air.  Admitted 12/30 for further care.  Had been developing worsening cough, body aches, subjective fever.  Chest x-ray 12/30 with extensive multifocal bilateral infiltrates.  Empiric coverage for CAP initiated.  Corticosteroids, remdesivir started, Actemra given x1.  Experienced progressive respiratory distress following admission, ultimately required intubation mechanical ventilation early 1/1.   Past Medical History   has a past medical history of Arthritis, Chronic kidney disease (CKD) stage G1/A1, glomerular filtration rate (GFR) equal to or greater than 90 mL/min/1.73 square meter and albuminuria creatinine ratio less than 30 mg/g, Diabetes mellitus without complication (HCC), Hyperlipidemia, and Hypertension.   Significant Hospital Events   1/1 VDRF 1/3 Transfer to [facility] from [facility] for evaluation of GI bleed 1/4 underwent EGD with clip placed on gastric ulcer with visible vessel  Consults:  PCCM  Procedures:  ETT 1/1 >>  PICC 1/2 >>   Significant Diagnostic Tests:    Micro Data:  SARS CoV2 12/30 >> positive Blood 12/30 >>  C. difficile 12/31 >> negative MRSA screen 1/1 >> negative  Antimicrobials:  Azithromycin 12/31 >>  Ceftriaxone 12/31 >>   Interim history/subjective:  S/p EGD Off Nimbex and pressors  Objective   Blood  pressure 129/64, pulse 97, temperature 98.5 F (36.9 C), temperature source Oral, resp. rate 17, height 5\' 3"  (1.6 m), weight 71.1 kg, SpO2 96 %.    Vent Mode: PRVC FiO2 (%):  [50 %-60 %] 50 % Set Rate:  [30 bmp] 30 bmp Vt Set:  [340 mL] 340 mL PEEP:  [10 cmH20] 10 cmH20 Plateau Pressure:  [22 cmH20-23 cmH20] 23 cmH20   Intake/Output Summary (Last 24 hours) at 03/26/2019 0837 Last data filed at 03/26/2019 0800 Gross per 24 hour  Intake 2300.05 ml  Output 1840 ml  Net 460.05 ml   Filed Weights   02/20/2019 1258 04/21/2019 0500 03/26/19 0333  Weight: 65.8 kg 69.3 kg 71.1 kg    Examination: Gen:      No acute distress, chronically ill appearing.  HEENT:  EOMI, sclera anicteric, ETT Neck:     No masses; no thyromegaly Lungs:    Clear to auscultation bilaterally; normal respiratory effort CV:         Regular rate and rhythm; no murmurs Abd:      + bowel sounds; soft, non-tender; no palpable masses, no distension Ext:    No edema; adequate peripheral perfusion Skin:      Warm and dry; no rash Neuro: Sedated  Resolved Hospital Problem list     Assessment & Plan:  Acute hypoxemic respiratory failure due to COVID-19 pneumonia Possible superimposed bacterial CAP Continue low tidal volume ventilation Continue remdesivir.  Steroids on hold due to GI bleed Off paralysis, follow ABG Wean down PEEP/Fio2 Continue CAP coverage for 7 days  Shock, presumed septic shock with possible superimposed effects of sedating medications Off pressors. Can attempt Lasix if he remains hemodynamically stable.  Diarrhea and now evidence  for GIB S/p EGD Monitor CBC Continue PPI  History of hypertension Continue to hold his amlodipine, carvedilol, doxazosin  Diabetes mellitus Levemir, SSI  Hyperlipidemia Home Pravachol on hold for now  Encephalopathy, need for sedation to facilitate mechanical ventilation Versed, fentanyl  Hypernatremia Start free water   Best practice:  Diet:  NPO Pain/Anxiety/Delirium protocol (if indicated): PAD protocol, plan Versed, fentanyl VAP protocol (if indicated): As ordered DVT prophylaxis: SCDs.  Heparin on hold due to GI bleed GI prophylaxis: Pepcid Glucose control: Sliding-scale insulin, Levemir Mobility: Bedrest Code Status: Full code Family Communication: Wife called and updated 1/5. Disposition: ICU  Labs   CBC: Recent Labs  Lab 03/21/19 0555 03/22/19 0115 03/23/19 0505 03/24/19 5277 03/24/19 1013 04-02-2019 0305 02-Apr-2019 1647 April 02, 2019 2040 April 02, 2019 2243 03/26/19 0317 03/26/19 0318  WBC 12.3* 16.1* 12.0* 11.4* 16.8* 12.5*  --   --   --  10.9*  --   NEUTROABS 11.5* 14.7* 10.4* 10.0*  --  9.5*  --   --   --   --   --   HGB 13.9 14.3 11.9* 12.5* 9.3* 11.4* 9.9* 8.5* 11.0* 10.9* 10.5*  HCT 42.1 43.3 36.6* 38.5* 29.0* 35.0* 30.8* 25.0* 34.4* 33.7* 31.0*  MCV 90.3 91.7 92.7 93.0 98.0 93.6  --   --   --  93.9  --   PLT 240 273 238 250 300 198  --   --   --  146*  --     Basic Metabolic Panel: Recent Labs  Lab 03/22/19 0115 03/23/19 0505 03/24/19 0318 04-02-19 0305 2019-04-02 1700 2019/04/02 2040 03/26/19 0317 03/26/19 0318  NA 142 144 145 152*  --  157* 156* 156*  K 4.5 3.6 3.8 3.7  --  4.0 4.1 3.9  CL 108 111 113* 125*  --   --  129*  --   CO2 24 22 22 23   --   --  22  --   GLUCOSE 193* 92 356* 168*  --   --  110*  --   BUN 49* 59* 50* 50*  --   --  33*  --   CREATININE 1.10 1.06 1.04 1.21  --   --  1.03  --   CALCIUM 9.1 8.4* 7.8* 7.0*  --   --  7.5*  --   MG 2.2 2.5* 2.3 2.2 2.1  --  2.2  --   PHOS  --   --  3.9 4.0 2.7  --  3.3  --    GFR: Estimated Creatinine Clearance: 56.6 mL/min (by C-G formula based on SCr of 1.03 mg/dL). Recent Labs  Lab 02/22/2019 1033 03/09/2019 1245 03/21/19 0612 03/22/19 0115 03/23/19 0505 03/24/19 0318 03/24/19 1013 April 02, 2019 0305 03/26/19 0317  PROCALCITON 0.68  --  11.51 0.70 0.35 0.11  --   --   --   WBC 14.5*  --   --  16.1* 12.0* 11.4* 16.8* 12.5* 10.9*  LATICACIDVEN  2.0* 1.4  --  1.4  --   --   --   --   --     Liver Function Tests: Recent Labs  Lab 03/21/19 0555 03/22/19 0115 03/23/19 0505 03/24/19 0318 02-Apr-2019 0305  AST 43* 52* 31 28 20   ALT 38 53* 40 38 27  ALKPHOS 86 103 89 83 55  BILITOT 0.6 0.3 0.8 0.5 0.5  PROT 6.7 7.1 5.2* 4.9* 3.4*  ALBUMIN 3.2* 3.1* 2.6* 2.2* 1.6*   No results for input(s): LIPASE, AMYLASE in the last 168 hours.  No results for input(s): AMMONIA in the last 168 hours.  ABG    Component Value Date/Time   PHART 7.340 (L) 03/26/2019 0318   PCO2ART 37.7 03/26/2019 0318   PO2ART 84.0 03/26/2019 0318   HCO3 20.3 03/26/2019 0318   TCO2 21 (L) 03/26/2019 0318   ACIDBASEDEF 5.0 (H) 03/26/2019 0318   O2SAT 96.0 03/26/2019 0318     Coagulation Profile: Recent Labs  Lab 03/24/19 1847  INR 1.4*    Cardiac Enzymes: No results for input(s): CKTOTAL, CKMB, CKMBINDEX, TROPONINI in the last 168 hours.  HbA1C: Hgb A1c MFr Bld  Date/Time Value Ref Range Status  April 19, 2019 10:33 AM 9.8 (H) 4.8 - 5.6 % Final    Comment:    (NOTE) Pre diabetes:          5.7%-6.4% Diabetes:              >6.4% Glycemic control for   <7.0% adults with diabetes     CBG: Recent Labs  Lab 04/15/2019 1931 04/18/2019 2246 04/18/2019 2357 03/26/19 0317 03/26/19 0809  GLUCAP 87 71 74 101* 118*    Critical care time:    The patient is critically ill with multiple organ system failure and requires high complexity decision making for assessment and support, frequent evaluation and titration of therapies, advanced monitoring, review of radiographic studies and interpretation of complex data.   Critical Care Time devoted to patient care services, exclusive of separately billable procedures, described in this note is 35 minutes.   Marshell Garfinkel MD [facility] Please see [email or web address] for pager details.  03/26/2019, 8:45 AM

## 2019-03-26 NOTE — Progress Notes (Signed)
Spoke with the patient's nurse over the phone.  Patient had a bowel movement yesterday which was not bloody or black. He has not been started on NG tube feedings as yet. Hemoglobin has stayed stable between 10.9 and 10.5. BUN has trended down from 50-33.  Okay to resume NG tube feeding. Patient on PPI twice daily.  We will sign off, please recall if needed.  Kerin Salen, MD

## 2019-03-26 NOTE — Progress Notes (Signed)
Pt with either a RASS -3 or +4. Weaning sedation as tolerated. Pt +4 with any minor stimulation, but resolves predominantly spontaneously...sustained on some occasions requiring bolus of sedation for decompensation in O2 sat. Pt improving as morning has progressed and have now weaned versed off and minimized fent. Pt diuresed well on lasix 40 and possibly contributing to improvement.

## 2019-03-26 NOTE — Progress Notes (Signed)
eLink Physician-Brief Progress Note Patient Name: Marc Young DOB: 10/22/1945 MRN: [id number]   Date of Service  03/26/2019  HPI/Events of Note  Hyperglycemia - Blood glucose = 188. Currently on Levemir.  eICU Interventions  Will order: 1. Q 4 hour sensitive Novolog SSI.      Intervention Category Major Interventions: Hyperglycemia - active titration of insulin therapy  Lenell Antu 03/26/2019, 9:21 PM

## 2019-03-26 NOTE — Progress Notes (Signed)
Inpatient Diabetes Program Recommendations  AACE/ADA: New Consensus Statement on Inpatient Glycemic Control (2015)  Target Ranges:  Prepandial:   less than 140 mg/dL      Peak postprandial:   less than 180 mg/dL (1-2 hours)      Critically ill patients:  140 - 180 mg/dL   Lab Results  Component Value Date   GLUCAP 118 (H) 03/26/2019   HGBA1C 9.8 (H) 02/23/2019    Review of Glycemic Control Results for [name], [name] (MRN [id number]) as of 03/26/2019 08:58  Ref. Range 2019-04-05 09:07 04/05/2019 11:18 Apr 05, 2019 12:10 2019-04-05 13:08 04-05-19 14:36 04-05-2019 17:19 04-05-19 18:05 April 05, 2019 19:31 2019/04/05 22:46 Apr 05, 2019 23:57 03/26/2019 03:17 03/26/2019 08:09  Glucose-Capillary Latest Ref Range: 70 - 99 mg/dL 90 78 91 97 90 40 (LL) 87 87 71 74 101 (H) 118 (H)    Diabetes history: Type 2 DM Outpatient Diabetes medications: Metformin 1000 mg BID, Trulicity 0.75 mg Qwk Current orders for Inpatient glycemic control:  Levemir 6 units Daily Novolog 1-3 units Q4 hours  Inpatient Diabetes Program Recommendations:    Noted hypoglycemia after Levemir 6 units given yesterday.  Glucose trends below inpatient goal.  -Consider decreasing Levemir to 4 units.  Thanks, Christena Deem RN, MSN, BC-ADM Inpatient Diabetes Coordinator Team Pager [phone] (8a-5p)

## 2019-03-27 ENCOUNTER — Inpatient Hospital Stay (HOSPITAL_COMMUNITY): Payer: Medicare Other

## 2019-03-27 LAB — POCT I-STAT 7, (LYTES, BLD GAS, ICA,H+H)
Acid-base deficit: 2 mmol/L (ref 0.0–2.0)
Bicarbonate: 23.7 mmol/L (ref 20.0–28.0)
Calcium, Ion: 1.3 mmol/L (ref 1.15–1.40)
HCT: 29 % — ABNORMAL LOW (ref 39.0–52.0)
Hemoglobin: 9.9 g/dL — ABNORMAL LOW (ref 13.0–17.0)
O2 Saturation: 99 %
Patient temperature: 97.5
Potassium: 4 mmol/L (ref 3.5–5.1)
Sodium: 152 mmol/L — ABNORMAL HIGH (ref 135–145)
TCO2: 25 mmol/L (ref 22–32)
pCO2 arterial: 41.6 mmHg (ref 32.0–48.0)
pH, Arterial: 7.36 (ref 7.350–7.450)
pO2, Arterial: 145 mmHg — ABNORMAL HIGH (ref 83.0–108.0)

## 2019-03-27 LAB — GLUCOSE, CAPILLARY
Glucose-Capillary: 277 mg/dL — ABNORMAL HIGH (ref 70–99)
Glucose-Capillary: 308 mg/dL — ABNORMAL HIGH (ref 70–99)
Glucose-Capillary: 318 mg/dL — ABNORMAL HIGH (ref 70–99)
Glucose-Capillary: 257 mg/dL — ABNORMAL HIGH (ref 70–99)
Glucose-Capillary: 212 mg/dL — ABNORMAL HIGH (ref 70–99)
Glucose-Capillary: 180 mg/dL — ABNORMAL HIGH (ref 70–99)

## 2019-03-27 LAB — BASIC METABOLIC PANEL
Anion gap: 8 (ref 5–15)
BUN: 31 mg/dL — ABNORMAL HIGH (ref 8–23)
CO2: 21 mmol/L — ABNORMAL LOW (ref 22–32)
Calcium: 8 mg/dL — ABNORMAL LOW (ref 8.9–10.3)
Chloride: 122 mmol/L — ABNORMAL HIGH (ref 98–111)
Creatinine, Ser: 1.1 mg/dL (ref 0.61–1.24)
GFR calc Af Amer: >60 mL/min (ref >60)
GFR calc non Af Amer: >60 mL/min (ref >60)
Glucose, Bld: 344 mg/dL — ABNORMAL HIGH (ref 70–99)
Potassium: 3.8 mmol/L (ref 3.5–5.1)
Sodium: 151 mmol/L — ABNORMAL HIGH (ref 135–145)

## 2019-03-27 LAB — CBC
HCT: 35.1 % — ABNORMAL LOW (ref 39.0–52.0)
Hemoglobin: 11.4 g/dL — ABNORMAL LOW (ref 13.0–17.0)
MCH: 30.4 pg (ref 26.0–34.0)
MCHC: 32.5 g/dL (ref 30.0–36.0)
MCV: 93.6 fL (ref 80.0–100.0)
Platelets: 114 10*3/uL — ABNORMAL LOW (ref 150–400)
RBC: 3.75 MIL/uL — ABNORMAL LOW (ref 4.22–5.81)
RDW: 15.9 % — ABNORMAL HIGH (ref 11.5–15.5)
WBC: 9.7 10*3/uL (ref 4.0–10.5)
nRBC: 0.2 % (ref 0.0–0.2)

## 2019-03-27 LAB — HEMOGLOBIN AND HEMATOCRIT, BLOOD
HCT: 31.3 % — ABNORMAL LOW (ref 39.0–52.0)
HCT: 31.8 % — ABNORMAL LOW (ref 39.0–52.0)
HCT: 32 % — ABNORMAL LOW (ref 39.0–52.0)
HCT: 32.6 % — ABNORMAL LOW (ref 39.0–52.0)
Hemoglobin: 10 g/dL — ABNORMAL LOW (ref 13.0–17.0)
Hemoglobin: 10.3 g/dL — ABNORMAL LOW (ref 13.0–17.0)
Hemoglobin: 10.3 g/dL — ABNORMAL LOW (ref 13.0–17.0)
Hemoglobin: 10.4 g/dL — ABNORMAL LOW (ref 13.0–17.0)

## 2019-03-27 LAB — MAGNESIUM: Magnesium: 2.1 mg/dL (ref 1.7–2.4)

## 2019-03-27 LAB — PHOSPHORUS: Phosphorus: 3.3 mg/dL (ref 2.5–4.6)

## 2019-03-27 MED ORDER — INSULIN DETEMIR 100 UNIT/ML ~~LOC~~ SOLN
10.0000 [IU] | Freq: Every day | SUBCUTANEOUS | Status: DC
  Administered 2019-03-27 – 2019-03-30 (×3): 10 [IU] via SUBCUTANEOUS
  Filled 2019-03-27 (×4): qty 0.1

## 2019-03-27 MED ORDER — QUETIAPINE FUMARATE 50 MG PO TABS
50.0000 mg | ORAL_TABLET | Freq: Two times a day (BID) | ORAL | Status: DC
  Administered 2019-03-27 – 2019-04-10 (×20): 50 mg via ORAL
  Filled 2019-03-27 (×21): qty 1

## 2019-03-27 MED ORDER — MORPHINE SULFATE (PF) 2 MG/ML IV SOLN
1.0000 mg | Freq: Once | INTRAVENOUS | Status: AC
  Administered 2019-03-27: 10:00:00 1 mg via INTRAVENOUS

## 2019-03-27 MED ORDER — MORPHINE BOLUS VIA INFUSION
2.0000 mg | INTRAVENOUS | Status: DC | PRN
  Filled 2019-03-27: qty 2

## 2019-03-27 MED ORDER — MORPHINE SULFATE 15 MG PO TABS
15.0000 mg | ORAL_TABLET | Freq: Four times a day (QID) | ORAL | Status: DC
  Administered 2019-03-27 – 2019-04-05 (×33): 15 mg via ORAL
  Filled 2019-03-27 (×33): qty 1

## 2019-03-27 MED ORDER — CLONAZEPAM 0.5 MG PO TABS
1.0000 mg | ORAL_TABLET | Freq: Two times a day (BID) | ORAL | Status: DC
  Administered 2019-03-27 – 2019-04-10 (×22): 1 mg
  Filled 2019-03-27 (×22): qty 2

## 2019-03-27 MED ORDER — MORPHINE BOLUS VIA INFUSION
0.5000 mg | INTRAVENOUS | Status: DC | PRN
  Administered 2019-03-27 – 2019-03-31 (×8): 0.5 mg via INTRAVENOUS
  Filled 2019-03-27: qty 1

## 2019-03-27 MED ORDER — HYDROMORPHONE HCL 2 MG PO TABS: 2.0000 mg | ORAL_TABLET | Freq: Four times a day (QID) | ORAL | Status: DC

## 2019-03-27 MED ORDER — PANTOPRAZOLE SODIUM 40 MG PO PACK
40.0000 mg | PACK | Freq: Two times a day (BID) | ORAL | Status: DC
  Administered 2019-03-27 – 2019-04-10 (×22): 40 mg
  Filled 2019-03-27 (×22): qty 20

## 2019-03-27 MED ORDER — FREE WATER
400.0000 mL | Freq: Three times a day (TID) | Status: DC
  Administered 2019-03-27 – 2019-04-03 (×18): 400 mL

## 2019-03-27 MED ORDER — SODIUM CHLORIDE 0.9 % IV SOLN: 0.0000 mg/h | INTRAVENOUS | Status: DC

## 2019-03-27 MED ORDER — MORPHINE 100MG IN NS 100ML (1MG/ML) PREMIX INFUSION
0.5000 mg/h | INTRAVENOUS | Status: DC
  Administered 2019-03-27: 10:00:00 0.5 mg/h via INTRAVENOUS
  Administered 2019-03-28 – 2019-03-29 (×2): 4 mg/h via INTRAVENOUS
  Administered 2019-03-30 (×2): 0.5 mg/h via INTRAVENOUS
  Administered 2019-03-30: 3 mg/h via INTRAVENOUS
  Administered 2019-03-30: 0.5 mg/h via INTRAVENOUS
  Filled 2019-03-27 (×4): qty 100

## 2019-03-27 NOTE — Progress Notes (Signed)
eLink Physician-Brief Progress Note Patient Name: Vaibhav Fogleman DOB: 08-20-1945 MRN: [id number]   Date of Service  03/27/2019  HPI/Events of Note  Multiple issues: 1. Hypoxia - Sat decreased down into mid 80's and 2. Ventilator Asynchrony.  eICU Interventions  Will order: 1. Increase PEEP to 12. 2. Increase Fentanyl IV infusion ceiling to 300 mcg/hour. 3. Increase Precedex IV infusion ceiling to 1.7 mcg/kg/hour.      Intervention Category Major Interventions: Respiratory failure - evaluation and management;Hypoxemia - evaluation and management  Gidget Quizhpi Dennard Nip 03/27/2019, 6:14 AM

## 2019-03-27 NOTE — Progress Notes (Signed)
NAME:  Marc Young, MRN:  [id number], DOB:  1946/02/27, LOS: 7 ADMISSION DATE:  03/19/2019, CONSULTATION DATE: 1/1 REFERRING MD: Dr. Randol Kern, CHIEF COMPLAINT: Acute respiratory failure  Brief History   74 year old man with diabetes and hypertension, admitted 12/30 with extensive bilateral COVID-19 pneumonia.  Required intubation 1/1.  History of present illness   74 year old man with history of diabetes and hypertension, viral symptoms beginning about 12/23.  Initial outpatient Covid testing negative but then COVID-19 positive on repeat.  Scheduled for monoclonal antibody infusion 12/30 but found to be significantly hypoxemic on room air.  Admitted 12/30 for further care.  Had been developing worsening cough, body aches, subjective fever.  Chest x-ray 12/30 with extensive multifocal bilateral infiltrates.  Empiric coverage for CAP initiated.  Corticosteroids, remdesivir started, Actemra given x1.  Experienced progressive respiratory distress following admission, ultimately required intubation mechanical ventilation early 1/1.   Past Medical History   has a past medical history of Arthritis, Chronic kidney disease (CKD) stage G1/A1, glomerular filtration rate (GFR) equal to or greater than 90 mL/min/1.73 square meter and albuminuria creatinine ratio less than 30 mg/g, Diabetes mellitus without complication (HCC), Hyperlipidemia, and Hypertension.   Significant Hospital Events   1/1 VDRF 1/3 Transfer to [facility] from [facility] for evaluation of GI bleed 1/4 underwent EGD with clip placed on gastric ulcer with visible vessel  Consults:  PCCM  Procedures:  ETT 1/1 >>  PICC 1/2 >>   Significant Diagnostic Tests:  03-29-19-EGD: 2 gastric ulcers clipped.  No high risk stigmata. 03/27/2019 CXR: bilateral dense infiltrates R>L.  Micro Data:  SARS CoV2 12/30 >> positive Blood 12/30 >>  C. difficile 12/31 >> negative MRSA screen 1/1 >> negative  Antimicrobials:  Azithromycin 12/31 >>  stopped Ceftriaxone 12/31 >> stopped  Interim history/subjective:  Required increase in PEEP and sedation for desaturation overnight.  Objective   Blood pressure (!) 144/72, pulse 79, temperature (!) 94.8 F (34.9 C), temperature source Axillary, resp. rate (!) 31, height 5\' 3"  (1.6 m), weight 71.1 kg, SpO2 100 %. CVP:  [12 mmHg] 12 mmHg  Vent Mode: PRVC FiO2 (%):  [50 %-80 %] 80 % Set Rate:  [30 bmp] 30 bmp Vt Set:  [340 mL] 340 mL PEEP:  [10 cmH20-12 cmH20] 12 cmH20 Plateau Pressure:  [21 cmH20] 21 cmH20   Intake/Output Summary (Last 24 hours) at 03/27/2019 0902 Last data filed at 03/27/2019 0800 Gross per 24 hour  Intake 1415.85 ml  Output 3200 ml  Net -1784.15 ml   Filed Weights   02/19/2019 1258 March 29, 2019 0500 03/26/19 0333  Weight: 65.8 kg 69.3 kg 71.1 kg    Examination: Gen:   Elderly cachectic male.  Intubated and sedated. HEENT: Endotracheal, orogastric tubes in place.  No skin breakdown. Lungs:   Bronchovesicular breathing with crackles throughout both lung fields.  Profound desaturation with minimal stimulation. CV:         Regular rate and rhythm; no murmurs Abd:      + bowel sounds; soft, non-tender; no palpable masses, no distension Ext:    No edema; adequate peripheral perfusion Skin:      Warm and dry; no rash Neuro: Sedated, becomes agitated and tachypneic with minimal stimulation.  Resolved Hospital Problem list     Assessment & Plan:  Critically ill due to acute hypoxemic respiratory failure due to COVID-19 pneumonia Possible superimposed bacterial CAP Acute blood loss anemia due to bleeding gastric ulcer. History of hypertension History of hyperlipidemia Type 2 diabetes Hypernatremia due to  free water restriction.  Upper GI bleeding now controlled.  Can restart enteral nutrition. Still severe ARDS which is difficult to ventilate despite successful diuresis.Marland Kitchen  Appears clinically euvolemic.  Sedation appears to be the major issue as patient desaturates  with minimal stimulation as has poor lung reserve. Continue lung protective ventilation and attempt to wean.  Maintain euvolemia.   Daily Goals Checklist  Pain/Anxiety/Delirium protocol (if indicated): Fentanyl and dexmedetomidine infusions.  We will initiate enteral sedation to minimize infusions. Switch to morphine infusion as will allow for greater drug duration. VAP protocol (if indicated): Bundle in place Respiratory support goals: Continue lung protective ventilation. Blood pressure target: Currently on no vasopressors.  Keep MAP greater than 65 DVT prophylaxis: SCDs only.  Will resume chemical prophylaxis 3 days post GI bleed. Nutrition Status: Nutrition Problem: Increased nutrient needs Etiology: acute illness(COVID-19 infection) Signs/Symptoms: estimated needs Interventions: Tube feeding GI prophylaxis: Twice daily PPI for GI bleed.  Can switch to oral. Fluid status goals: Clinically euvolemic Urinary catheter: Assessment of intravascular volume Central line: Arm triple-lumen PICC Glucose control: Type 2 diabetes with suboptimal glycemic control.  Will increase insulin detemir. Mobility/therapy needs: Bedrest. Antibiotic de-escalation: Stop antibiotics as respiratory culture is negative.  Remdesivir and tocilizumab completed. Home medication reconciliation: No relevant medications at this time. Daily labs: Daily BMP to follow sodium. Code Status: Full Family Communication: will update family Disposition: ICU  Labs   CBC: Recent Labs  Lab 03/21/19 0555 03/22/19 0115 03/23/19 0505 03/24/19 0318 03/24/19 1013 03/27/2019 0305 03/26/19 0317 03/26/19 1235 03/26/19 1740 03/27/19 0020 03/27/19 0300 03/27/19 0447  WBC 12.3* 16.1* 12.0* 11.4* 16.8* 12.5* 10.9*  --   --   --   --  9.7  NEUTROABS 11.5* 14.7* 10.4* 10.0*  --  9.5*  --   --   --   --   --   --   HGB 13.9 14.3 11.9* 12.5* 9.3* 11.4* 10.9* 10.6* 10.9* 10.4* 9.9* 11.4*  HCT 42.1 43.3 36.6* 38.5* 29.0* 35.0* 33.7*  33.4* 34.4* 31.8* 29.0* 35.1*  MCV 90.3 91.7 92.7 93.0 98.0 93.6 93.9  --   --   --   --  93.6  PLT 240 273 238 250 300 198 146*  --   --   --   --  114*    Basic Metabolic Panel: Recent Labs  Lab 03/23/19 0505 03/24/19 0318 2019/03/27 0305 2019/03/27 1700 2019/03/27 2040 03/26/19 0317 03/26/19 0318 03/27/19 0300 03/27/19 0447  NA 144 145 152*  --  157* 156* 156* 152* 151*  K 3.6 3.8 3.7  --  4.0 4.1 3.9 4.0 3.8  CL 111 113* 125*  --   --  129*  --   --  122*  CO2 22 22 23   --   --  22  --   --  21*  GLUCOSE 92 356* 168*  --   --  110*  --   --  344*  BUN 59* 50* 50*  --   --  33*  --   --  31*  CREATININE 1.06 1.04 1.21  --   --  1.03  --   --  1.10  CALCIUM 8.4* 7.8* 7.0*  --   --  7.5*  --   --  8.0*  MG 2.5* 2.3 2.2 2.1  --  2.2  --   --  2.1  PHOS  --  3.9 4.0 2.7  --  3.3  --   --  3.3  GFR: Estimated Creatinine Clearance: 53 mL/min (by C-G formula based on SCr of 1.1 mg/dL). Recent Labs  Lab 03/06/2019 1033 03/02/2019 1245 03/21/19 0612 03/22/19 0115 03/23/19 0505 03/24/19 0318 03/24/19 1013 04-03-2019 0305 03/26/19 0317 03/27/19 0447  PROCALCITON 0.68  --  11.51 0.70 0.35 0.11  --   --   --   --   WBC 14.5*  --   --  16.1* 12.0* 11.4* 16.8* 12.5* 10.9* 9.7  LATICACIDVEN 2.0* 1.4  --  1.4  --   --   --   --   --   --     Liver Function Tests: Recent Labs  Lab 03/21/19 0555 03/22/19 0115 03/23/19 0505 03/24/19 0318 2019/04/03 0305  AST 43* 52* 31 28 20   ALT 38 53* 40 38 27  ALKPHOS 86 103 89 83 55  BILITOT 0.6 0.3 0.8 0.5 0.5  PROT 6.7 7.1 5.2* 4.9* 3.4*  ALBUMIN 3.2* 3.1* 2.6* 2.2* 1.6*   No results for input(s): LIPASE, AMYLASE in the last 168 hours. No results for input(s): AMMONIA in the last 168 hours.  ABG    Component Value Date/Time   PHART 7.360 03/27/2019 0300   PCO2ART 41.6 03/27/2019 0300   PO2ART 145.0 (H) 03/27/2019 0300   HCO3 23.7 03/27/2019 0300   TCO2 25 03/27/2019 0300   ACIDBASEDEF 2.0 03/27/2019 0300   O2SAT 99.0 03/27/2019 0300       Coagulation Profile: Recent Labs  Lab 03/24/19 1847  INR 1.4*    Cardiac Enzymes: No results for input(s): CKTOTAL, CKMB, CKMBINDEX, TROPONINI in the last 168 hours.  HbA1C: Hgb A1c MFr Bld  Date/Time Value Ref Range Status  02/23/2019 10:33 AM 9.8 (H) 4.8 - 5.6 % Final    Comment:    (NOTE) Pre diabetes:          5.7%-6.4% Diabetes:              >6.4% Glycemic control for   <7.0% adults with diabetes     CBG: Recent Labs  Lab 03/26/19 1524 03/26/19 1943 03/26/19 2321 03/27/19 0333 03/27/19 0756  GLUCAP 175* 188* 230* 277* 308*    Critical care time:    CRITICAL CARE Performed by: 05/25/19   Total critical care time: 35 minutes  Critical care time was exclusive of separately billable procedures and treating other patients.  Critical care was necessary to treat or prevent imminent or life-threatening deterioration.  Critical care was time spent personally by me on the following activities: development of treatment plan with patient and/or surrogate as well as nursing, discussions with consultants, evaluation of patient's response to treatment, examination of patient, obtaining history from patient or surrogate, ordering and performing treatments and interventions, ordering and review of laboratory studies, ordering and review of radiographic studies, pulse oximetry, re-evaluation of patient's condition and participation in multidisciplinary rounds.  Lynnell Catalan, MD [facility] ICU Physician [facility] [place] [facility]  Pager: [phone] Mobile: [phone] After hours: [phone].

## 2019-03-27 NOTE — Progress Notes (Signed)
Assisted tele visit to patient with wife, Liborio Nixon.  Vena Austria, RN

## 2019-03-27 NOTE — Progress Notes (Addendum)
Inpatient Diabetes Program Recommendations  AACE/ADA: New Consensus Statement on Inpatient Glycemic Control (2015)  Target Ranges:  Prepandial:   less than 140 mg/dL      Peak postprandial:   less than 180 mg/dL (1-2 hours)      Critically ill patients:  140 - 180 mg/dL   Lab Results  Component Value Date   GLUCAP 308 (H) 03/27/2019   HGBA1C 9.8 (H) 03/03/2019    Review of Glycemic Control Results for Marc Young, Marc Young (MRN [id number]) as of 03/27/2019 08:28  Ref. Range 03/26/2019 08:09 03/26/2019 11:30 03/26/2019 15:24 03/26/2019 19:43 03/26/2019 23:21 03/27/2019 03:33 03/27/2019 07:56  Glucose-Capillary Latest Ref Range: 70 - 99 mg/dL 115 (H) 520 (H) 802 (H) 188 (H) 230 (H) 277 (H) 308 (H)   Diabetes history: Type 2 DM Outpatient Diabetes medications: Metformin 1000 mg BID, Trulicity 0.75 mg Qwk Current orders for Inpatient glycemic control:  Levemir 4 units Daily Novolog 1-3 units Q4 hours  Vital AF 55 ml/hour  Inpatient Diabetes Program Recommendations:    Noted hypoglycemia after Levemir 6 units. Levemir decreased to 4.   Glucose 300's this am after tube feeds started yesterday evening.  -Consider adding Novolog 6 units Q4 Tube Feed Coverage.  Thanks, Christena Deem RN, MSN, BC-ADM Inpatient Diabetes Coordinator Team Pager [phone] (8a-5p)

## 2019-03-27 NOTE — Plan of Care (Signed)
  Problem: Clinical Measurements: Goal: Diagnostic test results will improve Outcome: Progressing Goal: Respiratory complications will improve Outcome: Progressing Goal: Cardiovascular complication will be avoided Outcome: Progressing   Problem: Activity: Goal: Risk for activity intolerance will decrease Outcome: Progressing   Problem: Nutrition: Goal: Adequate nutrition will be maintained Outcome: Progressing   Problem: Elimination: Goal: Will not experience complications related to bowel motility Outcome: Progressing   Problem: Safety: Goal: Ability to remain free from injury will improve Outcome: Progressing   Problem: Skin Integrity: Goal: Risk for impaired skin integrity will decrease Outcome: Progressing

## 2019-03-28 ENCOUNTER — Inpatient Hospital Stay (HOSPITAL_COMMUNITY): Payer: Medicare Other

## 2019-03-28 LAB — HEMOGLOBIN AND HEMATOCRIT, BLOOD
HCT: 31.6 % — ABNORMAL LOW (ref 39.0–52.0)
HCT: 33.2 % — ABNORMAL LOW (ref 39.0–52.0)
HCT: 33.3 % — ABNORMAL LOW (ref 39.0–52.0)
HCT: 33.4 % — ABNORMAL LOW (ref 39.0–52.0)
Hemoglobin: 10.3 g/dL — ABNORMAL LOW (ref 13.0–17.0)
Hemoglobin: 10.7 g/dL — ABNORMAL LOW (ref 13.0–17.0)
Hemoglobin: 10.8 g/dL — ABNORMAL LOW (ref 13.0–17.0)
Hemoglobin: 11 g/dL — ABNORMAL LOW (ref 13.0–17.0)

## 2019-03-28 LAB — BASIC METABOLIC PANEL
Anion gap: 8 (ref 5–15)
BUN: 22 mg/dL (ref 8–23)
CO2: 27 mmol/L (ref 22–32)
Calcium: 7.9 mg/dL — ABNORMAL LOW (ref 8.9–10.3)
Chloride: 119 mmol/L — ABNORMAL HIGH (ref 98–111)
Creatinine, Ser: 0.86 mg/dL (ref 0.61–1.24)
GFR calc Af Amer: >60 mL/min (ref >60)
GFR calc non Af Amer: >60 mL/min (ref >60)
Glucose, Bld: 192 mg/dL — ABNORMAL HIGH (ref 70–99)
Potassium: 3.4 mmol/L — ABNORMAL LOW (ref 3.5–5.1)
Sodium: 154 mmol/L — ABNORMAL HIGH (ref 135–145)

## 2019-03-28 LAB — GLUCOSE, CAPILLARY
Glucose-Capillary: 157 mg/dL — ABNORMAL HIGH (ref 70–99)
Glucose-Capillary: 165 mg/dL — ABNORMAL HIGH (ref 70–99)
Glucose-Capillary: 201 mg/dL — ABNORMAL HIGH (ref 70–99)
Glucose-Capillary: 170 mg/dL — ABNORMAL HIGH (ref 70–99)
Glucose-Capillary: 165 mg/dL — ABNORMAL HIGH (ref 70–99)
Glucose-Capillary: 136 mg/dL — ABNORMAL HIGH (ref 70–99)
Glucose-Capillary: 129 mg/dL — ABNORMAL HIGH (ref 70–99)

## 2019-03-28 MED ORDER — CARVEDILOL 25 MG PO TABS
25.0000 mg | ORAL_TABLET | Freq: Two times a day (BID) | ORAL | Status: DC
  Administered 2019-03-28 – 2019-03-31 (×8): 25 mg
  Filled 2019-03-28 (×9): qty 1

## 2019-03-28 MED ORDER — DEXTROSE 10 % IV SOLN: INTRAVENOUS | Status: DC

## 2019-03-28 MED ORDER — FUROSEMIDE 10 MG/ML IJ SOLN
40.0000 mg | Freq: Two times a day (BID) | INTRAMUSCULAR | Status: AC
  Administered 2019-03-28 – 2019-03-30 (×5): 40 mg via INTRAVENOUS
  Filled 2019-03-28 (×4): qty 4

## 2019-03-28 NOTE — Progress Notes (Signed)
Inpatient Diabetes Program Recommendations  AACE/ADA: New Consensus Statement on Inpatient Glycemic Control (2015)  Target Ranges:  Prepandial:   less than 140 mg/dL      Peak postprandial:   less than 180 mg/dL (1-2 hours)      Critically ill patients:  140 - 180 mg/dL   Lab Results  Component Value Date   GLUCAP 165 (H) 03/28/2019   HGBA1C 9.8 (H) 02/23/2019    Review of Glycemic Control Results for ARDITH, LEWMAN (MRN [id number]) as of 03/28/2019 09:05  Ref. Range 03/27/2019 19:26 03/27/2019 23:07 03/28/2019 03:35 03/28/2019 08:39  Glucose-Capillary Latest Ref Range: 70 - 99 mg/dL 883 (H) 014 (H) 159 (H) 165 (H)   Diabetes history: Type 2 DM Outpatient Diabetes medications: Metformin 1000 mg BID, Trulicity 0.75 mg Qwk Current orders for Inpatient glycemic control:  Levemir 10 units Daily Novolog 0-9 units Q4 hours D10 @20  ml/hr  Inpatient Diabetes Program Recommendations:   If plan is to continue holding tube feeds, consider adjusting Levemir down to 6 units QD.   Thanks, , MSN, RNC-OB Diabetes Coordinator [phone] (8a-5p)

## 2019-03-28 NOTE — Progress Notes (Signed)
NAME:  Marc Young, MRN:  [id number], DOB:  29-Sep-1945, LOS: 8 ADMISSION DATE:  Mar 31, 2019, CONSULTATION DATE: 1/1 REFERRING MD: Dr. Randol Kern, CHIEF COMPLAINT: Acute respiratory failure  Brief History   74 year old man with diabetes and hypertension, admitted 12/30 with extensive bilateral COVID-19 pneumonia.  Required intubation 1/1.  History of present illness   74 year old man with history of diabetes and hypertension, viral symptoms beginning about 12/23.  Initial outpatient Covid testing negative but then COVID-19 positive on repeat.  Scheduled for monoclonal antibody infusion 12/30 but found to be significantly hypoxemic on room air.  Admitted 12/30 for further care.  Had been developing worsening cough, body aches, subjective fever.  Chest x-ray 12/30 with extensive multifocal bilateral infiltrates.  Empiric coverage for CAP initiated.  Corticosteroids, remdesivir started, Actemra given x1.  Experienced progressive respiratory distress following admission, ultimately required intubation mechanical ventilation early 1/1.   Past Medical History   has a past medical history of Arthritis, Chronic kidney disease (CKD) stage G1/A1, glomerular filtration rate (GFR) equal to or greater than 90 mL/min/1.73 square meter and albuminuria creatinine ratio less than 30 mg/g, Diabetes mellitus without complication (HCC), Hyperlipidemia, and Hypertension.   Significant Hospital Events   1/1 VDRF 1/3 Transfer to [facility] from [facility] for evaluation of GI bleed 1/4 underwent EGD with clip placed on gastric ulcer with visible vessel  Consults:  PCCM  Procedures:  ETT 1/1 >>  PICC 1/2 >>   Significant Diagnostic Tests:  04/13/2019-EGD: 2 gastric ulcers clipped.  No high risk stigmata. 03/27/2019 CXR: bilateral dense infiltrates R>L.  Micro Data:  SARS CoV2 12/30 >> positive Blood 12/30 >>  C. difficile 12/31 >> negative MRSA screen 1/1 >> negative  Antimicrobials:  Azithromycin 12/31 >>  stopped Ceftriaxone 12/31 >> stopped  Interim history/subjective:  Sedation strategy switched to morphine yesterday.  High gastric residual.  Tube feeds stopped.  Objective   Blood pressure (!) 195/70, pulse 76, temperature 98.6 F (37 C), temperature source Axillary, resp. rate (!) 25, height 5\' 3"  (1.6 m), weight 71.1 kg, SpO2 93 %.    Vent Mode: PRVC FiO2 (%):  [50 %-90 %] 50 % Set Rate:  [25 bmp] 25 bmp Vt Set:  [340 mL] 340 mL PEEP:  [12 cmH20] 12 cmH20 Plateau Pressure:  [21 cmH20-25 cmH20] 25 cmH20   Intake/Output Summary (Last 24 hours) at 03/28/2019 1022 Last data filed at 03/28/2019 1000 Gross per 24 hour  Intake 2976.34 ml  Output 2435 ml  Net 541.34 ml   Filed Weights   2019-03-31 1258 04/07/2019 0500 03/26/19 0333  Weight: 65.8 kg 69.3 kg 71.1 kg    Examination: Gen:   Elderly cachectic male.  Intubated and sedated. HEENT: Endotracheal, orogastric tubes in place.  No skin breakdown. Lungs:   Bronchovesicular breathing with crackles throughout both lung fields.   CV:         Regular rate and rhythm; no murmurs Abd:      Absent bowel sounds; soft, non-tender; no palpable masses, no distension.  Moderate amount of gastric aspirate and suction canister. Ext:    No edema; adequate peripheral perfusion Skin:      Warm and dry; no rash Neuro: Sedated, does not become quite as agitated with stimulation.  Resolved Hospital Problem list     Assessment & Plan:  Critically ill due to acute hypoxemic respiratory failure due to COVID-19 pneumonia Possible superimposed bacterial CAP Acute blood loss anemia due to bleeding gastric ulcer. History of hypertension History of hyperlipidemia  Type 2 diabetes Hypernatremia due to free water restriction.  Upper GI bleeding now controlled but tube feed intolerance.  We will proceed with Still severe ARDS but is more compliant with mechanical ventilation with change in sedation strategy.  However no substantial progress in ventilator  weaning as yet as PEEP remains too high for SBT and oxygenation remains marginal. Continue diuresis to maintain euvolemia.  Daily Goals Checklist  Pain/Anxiety/Delirium protocol (if indicated): Morphine infusion, wean dexmedetomidine.   VAP protocol (if indicated): Bundle in place Respiratory support goals: Continue lung protective ventilation. Blood pressure target: Currently on no vasopressors.  Keep MAP greater than 65 DVT prophylaxis: SCDs only.  Will resume chemical prophylaxis 3 days post GI bleed. Nutrition Status: Nutrition Problem: Increased nutrient needs Etiology: acute illness(COVID-19 infection) Signs/Symptoms: estimated needs Interventions: Tube feeding on hold. Will place post-pyloric tube. GI prophylaxis: Twice daily PPI for GI bleed. Fluid status goals: Clinically euvolemic Urinary catheter: Assessment of intravascular volume Central line: Right arm triple-lumen PICC Glucose control: Type 2 diabetes with improved glycemic control with increase in insulin detemir.   Mobility/therapy needs: Bedrest. Antibiotic de-escalation: Stop antibiotics as respiratory culture is negative.  Remdesivir and tocilizumab completed. Home medication reconciliation: No relevant medications at this time. Daily labs: Daily BMP to follow sodium. Code Status: Full Family Communication: Wife updated yesterday. Disposition: ICU  Labs   CBC: Recent Labs  Lab 03/22/19 0115 03/23/19 0505 03/24/19 0318 03/24/19 1013 04/22/2019 0305 03/26/19 0317 03/27/19 0447 03/27/19 1047 03/27/19 1647 03/27/19 2256 03/28/19 0433  WBC 16.1* 12.0* 11.4* 16.8* 12.5* 10.9* 9.7  --   --   --   --   NEUTROABS 14.7* 10.4* 10.0*  --  9.5*  --   --   --   --   --   --   HGB 14.3 11.9* 12.5* 9.3* 11.4* 10.9* 11.4* 10.3* 10.3* 10.0* 10.3*  HCT 43.3 36.6* 38.5* 29.0* 35.0* 33.7* 35.1* 32.6* 32.0* 31.3* 31.6*  MCV 91.7 92.7 93.0 98.0 93.6 93.9 93.6  --   --   --   --   PLT 273 238 250 300 198 146* 114*  --   --    --   --     Basic Metabolic Panel: Recent Labs  Lab 03/24/19 0318 2019-04-22 0305 22-Apr-2019 1700 03/26/19 0317 03/26/19 0318 03/27/19 0300 03/27/19 0447 03/28/19 0433  NA 145 152*  --  156* 156* 152* 151* 154*  K 3.8 3.7  --  4.1 3.9 4.0 3.8 3.4*  CL 113* 125*  --  129*  --   --  122* 119*  CO2 22 23  --  22  --   --  21* 27  GLUCOSE 356* 168*  --  110*  --   --  344* 192*  BUN 50* 50*  --  33*  --   --  31* 22  CREATININE 1.04 1.21  --  1.03  --   --  1.10 0.86  CALCIUM 7.8* 7.0*  --  7.5*  --   --  8.0* 7.9*  MG 2.3 2.2 2.1 2.2  --   --  2.1  --   PHOS 3.9 4.0 2.7 3.3  --   --  3.3  --    GFR: Estimated Creatinine Clearance: 67.7 mL/min (by C-G formula based on SCr of 0.86 mg/dL). Recent Labs  Lab 03/22/19 0115 03/23/19 0505 03/24/19 0318 03/24/19 1013 Apr 22, 2019 0305 03/26/19 0317 03/27/19 0447  PROCALCITON 0.70 0.35 0.11  --   --   --   --  WBC 16.1* 12.0* 11.4* 16.8* 12.5* 10.9* 9.7  LATICACIDVEN 1.4  --   --   --   --   --   --     Liver Function Tests: Recent Labs  Lab 03/22/19 0115 03/23/19 0505 03/24/19 0318 04/04/2019 0305  AST 52* 31 28 20   ALT 53* 40 38 27  ALKPHOS 103 89 83 55  BILITOT 0.3 0.8 0.5 0.5  PROT 7.1 5.2* 4.9* 3.4*  ALBUMIN 3.1* 2.6* 2.2* 1.6*   No results for input(s): LIPASE, AMYLASE in the last 168 hours. No results for input(s): AMMONIA in the last 168 hours.  ABG    Component Value Date/Time   PHART 7.360 03/27/2019 0300   PCO2ART 41.6 03/27/2019 0300   PO2ART 145.0 (H) 03/27/2019 0300   HCO3 23.7 03/27/2019 0300   TCO2 25 03/27/2019 0300   ACIDBASEDEF 2.0 03/27/2019 0300   O2SAT 99.0 03/27/2019 0300     Coagulation Profile: Recent Labs  Lab 03/24/19 1847  INR 1.4*    Cardiac Enzymes: No results for input(s): CKTOTAL, CKMB, CKMBINDEX, TROPONINI in the last 168 hours.  HbA1C: Hgb A1c MFr Bld  Date/Time Value Ref Range Status  04/04/19 10:33 AM 9.8 (H) 4.8 - 5.6 % Final    Comment:    (NOTE) Pre diabetes:           5.7%-6.4% Diabetes:              >6.4% Glycemic control for   <7.0% adults with diabetes     CBG: Recent Labs  Lab 03/27/19 1514 03/27/19 1926 03/27/19 2307 03/28/19 0335 03/28/19 0839  GLUCAP 257* 212* 180* 157* 165*    Critical care time:    CRITICAL CARE Performed by: Kipp Brood   Total critical care time: 35 minutes  Critical care time was exclusive of separately billable procedures and treating other patients.  Critical care was necessary to treat or prevent imminent or life-threatening deterioration.  Critical care was time spent personally by me on the following activities: development of treatment plan with patient and/or surrogate as well as nursing, discussions with consultants, evaluation of patient's response to treatment, examination of patient, obtaining history from patient or surrogate, ordering and performing treatments and interventions, ordering and review of laboratory studies, ordering and review of radiographic studies, pulse oximetry, re-evaluation of patient's condition and participation in multidisciplinary rounds.  Kipp Brood, MD [facility] ICU Physician [place]  Pager: [phone] Mobile: [phone] After hours: [phone].

## 2019-03-28 NOTE — Progress Notes (Signed)
eLink Physician-Brief Progress Note Patient Name: Marc Young DOB: [birth year] MRN: [id number]   Date of Service  03/28/2019  HPI/Events of Note  High gastric residual of about 350 mL.   eICU Interventions  Will order: 1. Hold tube feeds until the patient is re-evaluated by the rounding team.  2. D10W IV infusion to run at 20 mL/hour. 3. Gastric tube to LIS.     Intervention Category Major Interventions: Other:  Lenell Antu 03/28/2019, 4:24 AM

## 2019-03-28 NOTE — Plan of Care (Signed)
  Problem: Respiratory: Goal: Will maintain a patent airway Outcome: Progressing Goal: Complications related to the disease process, condition or treatment will be avoided or minimized Outcome: Progressing   Problem: Education: Goal: Knowledge of General Education information will improve Description: Including pain rating scale, medication(s)/side effects and non-pharmacologic comfort measures Outcome: Progressing   Problem: Health Behavior/Discharge Planning: Goal: Ability to manage health-related needs will improve Outcome: Progressing   Problem: Clinical Measurements: Goal: Ability to maintain clinical measurements within normal limits will improve Outcome: Progressing Goal: Diagnostic test results will improve Outcome: Progressing Goal: Respiratory complications will improve Outcome: Progressing Goal: Cardiovascular complication will be avoided Outcome: Progressing   Problem: Nutrition: Goal: Adequate nutrition will be maintained Outcome: Progressing   Problem: Pain Managment: Goal: General experience of comfort will improve Outcome: Progressing   Problem: Safety: Goal: Ability to remain free from injury will improve Outcome: Progressing   Problem: Skin Integrity: Goal: Risk for impaired skin integrity will decrease Outcome: Progressing

## 2019-03-29 DIAGNOSIS — J9600 Acute respiratory failure, unspecified whether with hypoxia or hypercapnia: Secondary | ICD-10-CM

## 2019-03-29 LAB — BASIC METABOLIC PANEL
Anion gap: 8 (ref 5–15)
BUN: 15 mg/dL (ref 8–23)
CO2: 29 mmol/L (ref 22–32)
Calcium: 7.6 mg/dL — ABNORMAL LOW (ref 8.9–10.3)
Chloride: 111 mmol/L (ref 98–111)
Creatinine, Ser: 0.75 mg/dL (ref 0.61–1.24)
GFR calc Af Amer: >60 mL/min (ref >60)
GFR calc non Af Amer: >60 mL/min (ref >60)
Glucose, Bld: 233 mg/dL — ABNORMAL HIGH (ref 70–99)
Potassium: 3 mmol/L — ABNORMAL LOW (ref 3.5–5.1)
Sodium: 148 mmol/L — ABNORMAL HIGH (ref 135–145)

## 2019-03-29 LAB — HEMOGLOBIN AND HEMATOCRIT, BLOOD
HCT: 32.5 % — ABNORMAL LOW (ref 39.0–52.0)
Hemoglobin: 10.3 g/dL — ABNORMAL LOW (ref 13.0–17.0)

## 2019-03-29 LAB — GLUCOSE, CAPILLARY
Glucose-Capillary: 101 mg/dL — ABNORMAL HIGH (ref 70–99)
Glucose-Capillary: 118 mg/dL — ABNORMAL HIGH (ref 70–99)
Glucose-Capillary: 167 mg/dL — ABNORMAL HIGH (ref 70–99)
Glucose-Capillary: 194 mg/dL — ABNORMAL HIGH (ref 70–99)
Glucose-Capillary: 213 mg/dL — ABNORMAL HIGH (ref 70–99)
Glucose-Capillary: 258 mg/dL — ABNORMAL HIGH (ref 70–99)

## 2019-03-29 MED ORDER — ASCORBIC ACID 500 MG PO TABS
500.0000 mg | ORAL_TABLET | Freq: Every day | ORAL | Status: DC
  Administered 2019-03-29 – 2019-04-10 (×11): 500 mg
  Filled 2019-03-29 (×11): qty 1

## 2019-03-29 MED ORDER — ACETAMINOPHEN 325 MG PO TABS
650.0000 mg | ORAL_TABLET | Freq: Four times a day (QID) | ORAL | Status: DC | PRN
  Administered 2019-04-01: 03:00:00 650 mg
  Filled 2019-03-29: qty 2

## 2019-03-29 MED ORDER — POTASSIUM CHLORIDE 20 MEQ/15ML (10%) PO SOLN
40.0000 meq | Freq: Once | ORAL | Status: AC
  Administered 2019-03-29: 40 meq
  Filled 2019-03-29: qty 30

## 2019-03-29 MED ORDER — PRAVASTATIN SODIUM 40 MG PO TABS: 80.0000 mg | ORAL_TABLET | Freq: Every day | ORAL | Status: DC

## 2019-03-29 MED ORDER — MIDAZOLAM HCL 2 MG/2ML IJ SOLN
2.0000 mg | INTRAMUSCULAR | Status: DC | PRN
  Administered 2019-03-30 – 2019-03-31 (×6): 2 mg via INTRAVENOUS
  Filled 2019-03-29 (×6): qty 2

## 2019-03-29 MED ORDER — VITAL AF 1.2 CAL PO LIQD
1000.0000 mL | ORAL | Status: DC
  Administered 2019-03-29 – 2019-04-02 (×5): 1000 mL
  Filled 2019-03-29: qty 1000

## 2019-03-29 MED ORDER — PRAVASTATIN SODIUM 40 MG PO TABS
80.0000 mg | ORAL_TABLET | Freq: Every day | ORAL | Status: DC
  Administered 2019-03-29 – 2019-04-03 (×5): 80 mg
  Filled 2019-03-29 (×6): qty 2

## 2019-03-29 MED ORDER — ENOXAPARIN SODIUM 40 MG/0.4ML ~~LOC~~ SOLN
40.0000 mg | SUBCUTANEOUS | Status: DC
  Administered 2019-03-29 – 2019-04-01 (×4): 40 mg via SUBCUTANEOUS
  Filled 2019-03-29 (×5): qty 0.4

## 2019-03-29 MED ORDER — PRO-STAT SUGAR FREE PO LIQD
30.0000 mL | Freq: Every day | ORAL | Status: DC
  Administered 2019-03-29 – 2019-04-04 (×7): 30 mL via ORAL
  Filled 2019-03-29 (×6): qty 30

## 2019-03-29 NOTE — Progress Notes (Signed)
NAME:  Marc Young, MRN:  [id number], DOB:  05-28-1945, LOS: 9 ADMISSION DATE:  03/19/2019, CONSULTATION DATE: 1/1 REFERRING MD: Dr. Randol Kern, CHIEF COMPLAINT: Acute respiratory failure  Brief History   74 yo male developed viral respiratory symptoms from 12/23.  Found to have COVID 19 pneumonia.  Treated with steroids, remdesivir, tocilizumab.  Required intubation 1/01.  Past Medical History  CKD 1, DM, HLD, HTN  Significant Hospital Events   1/1 VDRF, start nimbex 1/3 Transfer to [facility] from [facility] for evaluation of GI bleed 1/4 underwent EGD with clip placed on gastric ulcer with visible vessel; transition off nimbex  Consults:  GI  Procedures:  ETT 1/1 >>  Rt PICC 1/2 >>   Significant Diagnostic Tests:  1/4 EGD >> 2 gastric ulcers clipped.  No high risk stigmata.  Micro Data:  SARS CoV2 12/30 >> positive Blood 12/30 >> negative C. difficile 12/31 >> negative  COVID Therapy:  Remdesivir 12/30 >> 1/03 Tocilizumab 12/30   Antimicrobials:  Azithromycin 12/31 >> 1/06 Ceftriaxone 12/31 >> 1/05  Interim history/subjective:  Agitated and tachypneic with stimulation.  Objective   Blood pressure 138/65, pulse 95, temperature 98.1 F (36.7 C), temperature source Oral, resp. rate (!) 32, height 5\' 3"  (1.6 m), weight 70.2 kg, SpO2 99 %.    Vent Mode: PRVC FiO2 (%):  [40 %-50 %] 40 % Set Rate:  [25 bmp] 25 bmp Vt Set:  [340 mL] 340 mL PEEP:  [10 cmH20-12 cmH20] 10 cmH20 Plateau Pressure:  [21 cmH20-25 cmH20] 21 cmH20   Intake/Output Summary (Last 24 hours) at 03/29/2019 0831 Last data filed at 03/29/2019 0600 Gross per 24 hour  Intake 1318.51 ml  Output 4590 ml  Net -3271.49 ml   Filed Weights   04/07/2019 0500 03/26/19 0333 03/29/19 0445  Weight: 69.3 kg 71.1 kg 70.2 kg    Examination:  General - sedated Eyes - pupils reactive ENT - ETT in place Cardiac - regular rate/rhythm, no murmur Chest - b/l crackles Abdomen - soft, non tender, + bowel  sounds Extremities - 1+ edema Skin - no rashes Neuro - RASS +1   Resolved Hospital Problem list:    Assessment & Plan:   Acute hypoxic respiratory failure from COVID 19 pneumonia with ARDS with possible bacterial HCAP. - goal SpO2 88 to 95% - f/u CXR - continue lasix  ABLA from Gastric ulcer. - continue protonix - f/u CBC - transfuse for Hb < 7  Hx of HTN, HLD. - coreg, pravastatin  DM type 2. - SSI  Hypernatremia. - free water  Hypokalemia. - replace as needed  Acute metabolic encephalopathy from hypoxia. - RASS goal 0 to -1   Best practice:  DVT prophylaxis: lovenox SUP: protonix Nutrition: tube feeds Mobility: bed rest Goals of care: full code Disposition: ICU  Labs:   CMP Latest Ref Rng & Units 03/29/2019 03/28/2019 03/27/2019  Glucose 70 - 99 mg/dL 05/25/2019) 462(V) 035(K)  BUN 8 - 23 mg/dL 15 22 093(G)  Creatinine 0.61 - 1.24 mg/dL 18(E 9.93 7.16  Sodium 135 - 145 mmol/L 148(H) 154(H) 151(H)  Potassium 3.5 - 5.1 mmol/L 3.0(L) 3.4(L) 3.8  Chloride 98 - 111 mmol/L 111 119(H) 122(H)  CO2 22 - 32 mmol/L 29 27 21(L)  Calcium 8.9 - 10.3 mg/dL 7.6(L) 7.9(L) 8.0(L)  Total Protein 6.5 - 8.1 g/dL - - -  Total Bilirubin 0.3 - 1.2 mg/dL - - -  Alkaline Phos 38 - 126 U/L - - -  AST 15 -  41 U/L - - -  ALT 0 - 44 U/L - - -    CBC Latest Ref Rng & Units 03/29/2019 03/28/2019 03/28/2019  WBC 4.0 - 10.5 K/uL - - -  Hemoglobin 13.0 - 17.0 g/dL 10.3(L) 11.0(L) 10.8(L)  Hematocrit 39.0 - 52.0 % 32.5(L) 33.4(L) 33.3(L)  Platelets 150 - 400 K/uL - - -    ABG    Component Value Date/Time   PHART 7.360 03/27/2019 0300   PCO2ART 41.6 03/27/2019 0300   PO2ART 145.0 (H) 03/27/2019 0300   HCO3 23.7 03/27/2019 0300   TCO2 25 03/27/2019 0300   ACIDBASEDEF 2.0 03/27/2019 0300   O2SAT 99.0 03/27/2019 0300    CBG (last 3)  Recent Labs    03/28/19 2312 03/29/19 0320 03/29/19 0754  GLUCAP 129* 118* 167*   CC time 33 minutes  Chesley Mires, MD [place] [facility]/Critical  Care 03/29/2019, 2:17 PM

## 2019-03-29 NOTE — Procedures (Addendum)
Cortrak  Person Inserting Tube:  Rafe Mackowski, RD Tube Type:  Cortrak - 43 inches Tube Location:  Right nare Initial Placement:  Postpyloric Secured by: Bridle Technique Used to Measure Tube Placement:  Documented cm marking at nare/ corner of mouth Cortrak Secured At:  83 cm   No x-ray is required. RN may begin using tube.   If the tube becomes dislodged please keep the tube and contact the Cortrak team at [email or web address] (password [email or web address]) for replacement.  If after hours and replacement cannot be delayed, place a NG tube and confirm placement with an abdominal x-ray.    Vanessa Kick RD, LDN Clinical Nutrition Pager # [phone]

## 2019-03-29 NOTE — Progress Notes (Addendum)
Nutrition Follow-up  DOCUMENTATION CODES:   Not applicable  INTERVENTION:   Tube Feeding:  Increase Vital AF 1.2 to 65 ml/hr Pro-Stat 30 mL daily Provides 132 g of protein, 1972 kcals and 1263 mL of free water  Total free water with 400 mL q 8 hour flush: 2463 mL of free water   NUTRITION DIAGNOSIS:   Increased nutrient needs related to acute illness(COVID-19 infection) as evidenced by estimated needs.  Being addressed via TF   GOAL:   Patient will meet greater than or equal to 90% of their needs  Progressing   MONITOR:   Vent status, Labs, Weight trends, TF tolerance, I & O's  REASON FOR ASSESSMENT:   Consult Enteral/tube feeding initiation and management  ASSESSMENT:   74 year old man with history of diabetes and hypertension, viral symptoms beginning about 12/23.  Initial outpatient Covid testing negative but then COVID-19 positive on repeat.  Scheduled for monoclonal antibody infusion 12/30 but found to be significantly hypoxemic on room air.  Admitted 12/30 for further care.  Had been developing worsening cough, body aches, subjective fever.  Chest x-ray 12/30 with extensive multifocal bilateral infiltrates.  Empiric coverage for CAP initiated.  Corticosteroids, remdesivir started, Actemra given x1.  Experienced progressive respiratory distress following admission, ultimately required intubation mechanical ventilation early 1/1.  1/01 intubated, paralyzed 1/03 Transfer to [facility] from [facility] due to GI bleed 1/04 EGD with clip placed on gastric ulcer, paralytic d/c 1/07 TF held for high residuals???  Apparently, residuals checked and TF held due to residuals of 350 mL. Practice of checking residuals for "TF tolerance" is not evidenced based; ASPEN recommends NOT checking residuals; but in the instance that residuals are checked, ASPEN recommends not holding for residuals unless >500 mL.   Regardless, Cortrak placed today, plan to resume TF    Noted free water of 400 mL  q 8 hours for hypernatremia but OG has been to suction. Free water to resume via Cortrak  Admit weigh 69.3 kg; current wt 70.2 kg. Noted net negative 2 L per I/O flow sheet  Labs: sodium 148 (H), potassium 3.0 (L), Creatinine wdl Meds: ascorbic acid, lasix, ss novolog, levemir, zinc sulfate   Diet Order:   Diet Order            Diet NPO time specified  Diet effective now              EDUCATION NEEDS:   No education needs have been identified at this time  Skin:  Skin Assessment: Reviewed RN Assessment  Last BM:  1/8  Height:   Ht Readings from Last 1 Encounters:  03/27/19 5\' 3"  (1.6 m)    Weight:   Wt Readings from Last 1 Encounters:  03/29/19 70.2 kg    Ideal Body Weight:  56.3 kg  BMI:  Body mass index is 27.42 kg/m.  Estimated Nutritional Needs:   Kcal:  1820-2110 kcals  Protein:  105-140 g  Fluid:  2L/day   05/27/19 MS, RDN, LDN, CNSC [phone] Pager  [phone] Weekend/On-Call Pager

## 2019-03-30 ENCOUNTER — Inpatient Hospital Stay (HOSPITAL_COMMUNITY): Payer: Medicare Other

## 2019-03-30 LAB — GLUCOSE, CAPILLARY
Glucose-Capillary: 193 mg/dL — ABNORMAL HIGH (ref 70–99)
Glucose-Capillary: 234 mg/dL — ABNORMAL HIGH (ref 70–99)
Glucose-Capillary: 255 mg/dL — ABNORMAL HIGH (ref 70–99)
Glucose-Capillary: 292 mg/dL — ABNORMAL HIGH (ref 70–99)
Glucose-Capillary: 315 mg/dL — ABNORMAL HIGH (ref 70–99)
Glucose-Capillary: 354 mg/dL — ABNORMAL HIGH (ref 70–99)

## 2019-03-30 LAB — POCT I-STAT 7, (LYTES, BLD GAS, ICA,H+H)
Acid-Base Excess: 9 mmol/L — ABNORMAL HIGH (ref 0.0–2.0)
Bicarbonate: 32.3 mmol/L — ABNORMAL HIGH (ref 20.0–28.0)
Calcium, Ion: 1.16 mmol/L (ref 1.15–1.40)
HCT: 30 % — ABNORMAL LOW (ref 39.0–52.0)
Hemoglobin: 10.2 g/dL — ABNORMAL LOW (ref 13.0–17.0)
O2 Saturation: 92 %
Patient temperature: 98.8
Potassium: 3.1 mmol/L — ABNORMAL LOW (ref 3.5–5.1)
Sodium: 147 mmol/L — ABNORMAL HIGH (ref 135–145)
TCO2: 33 mmol/L — ABNORMAL HIGH (ref 22–32)
pCO2 arterial: 38.6 mmHg (ref 32.0–48.0)
pH, Arterial: 7.531 — ABNORMAL HIGH (ref 7.350–7.450)
pO2, Arterial: 56 mmHg — ABNORMAL LOW (ref 83.0–108.0)

## 2019-03-30 LAB — BASIC METABOLIC PANEL
Anion gap: 9 (ref 5–15)
BUN: 22 mg/dL (ref 8–23)
CO2: 31 mmol/L (ref 22–32)
Calcium: 7.7 mg/dL — ABNORMAL LOW (ref 8.9–10.3)
Chloride: 107 mmol/L (ref 98–111)
Creatinine, Ser: 0.96 mg/dL (ref 0.61–1.24)
GFR calc Af Amer: >60 mL/min (ref >60)
GFR calc non Af Amer: >60 mL/min (ref >60)
Glucose, Bld: 369 mg/dL — ABNORMAL HIGH (ref 70–99)
Potassium: 3.1 mmol/L — ABNORMAL LOW (ref 3.5–5.1)
Sodium: 147 mmol/L — ABNORMAL HIGH (ref 135–145)

## 2019-03-30 LAB — CBC
HCT: 31.8 % — ABNORMAL LOW (ref 39.0–52.0)
Hemoglobin: 10.4 g/dL — ABNORMAL LOW (ref 13.0–17.0)
MCH: 31.1 pg (ref 26.0–34.0)
MCHC: 32.7 g/dL (ref 30.0–36.0)
MCV: 95.2 fL (ref 80.0–100.0)
Platelets: 85 10*3/uL — ABNORMAL LOW (ref 150–400)
RBC: 3.34 MIL/uL — ABNORMAL LOW (ref 4.22–5.81)
RDW: 15.8 % — ABNORMAL HIGH (ref 11.5–15.5)
WBC: 5.4 10*3/uL (ref 4.0–10.5)
nRBC: 0 % (ref 0.0–0.2)

## 2019-03-30 LAB — MAGNESIUM: Magnesium: 1.8 mg/dL (ref 1.7–2.4)

## 2019-03-30 MED ORDER — INSULIN DETEMIR 100 UNIT/ML ~~LOC~~ SOLN
15.0000 [IU] | Freq: Every day | SUBCUTANEOUS | Status: DC
  Filled 2019-03-30: qty 0.15

## 2019-03-30 MED ORDER — POTASSIUM CHLORIDE 20 MEQ/15ML (10%) PO SOLN
40.0000 meq | Freq: Four times a day (QID) | ORAL | Status: AC
  Administered 2019-03-30 (×2): 40 meq
  Filled 2019-03-30 (×2): qty 30

## 2019-03-30 MED ORDER — FUROSEMIDE 10 MG/ML IJ SOLN
40.0000 mg | Freq: Once | INTRAMUSCULAR | Status: AC
  Administered 2019-03-30: 40 mg via INTRAVENOUS
  Filled 2019-03-30: qty 4

## 2019-03-30 NOTE — Progress Notes (Signed)
Assisted tele visit to patient with wife.  Del Overfelt Ann, RN  

## 2019-03-30 NOTE — Progress Notes (Addendum)
eLink Physician-Brief Progress Note Patient Name: Marc Young DOB: 1945/07/08 MRN: [id number]   Date of Service  03/30/2019  HPI/Events of Note  Tachypnea - Now back on Morphine IV infusion.  eICU Interventions  Will order: 1. ABG STAT. 2. Portable CXR STAT.     Intervention Category Major Interventions: Other:  Emanii Bugbee Dennard Nip 03/30/2019, 1:35 AM

## 2019-03-30 NOTE — Progress Notes (Signed)
Spokes with pt's wife.  Updated about current status and treatment plan.  Explained he is starting pressure support trials.  Explained he might need tracheostomy to assist with vent weaning.  Coralyn Helling, MD [facility] Pulmonary/Critical Care 03/30/2019, 11:49 AM

## 2019-03-30 NOTE — Progress Notes (Signed)
NAME:  Marc Young, MRN:  [id number], DOB:  07-Mar-1946, LOS: 10 ADMISSION DATE:  03/04/2019, CONSULTATION DATE: 1/1 REFERRING MD: Dr. Randol Kern, CHIEF COMPLAINT: Acute respiratory failure  Brief History   74 yo male developed viral respiratory symptoms from 12/23.  Found to have COVID 19 pneumonia.  Treated with steroids, remdesivir, tocilizumab.  Required intubation 1/01.  Past Medical History  CKD 1, DM, HLD, HTN  Significant Hospital Events   1/1 VDRF, start nimbex 1/3 Transfer to [facility] from [facility] for evaluation of GI bleed 1/4 underwent EGD with clip placed on gastric ulcer with visible vessel; transition off nimbex 1/9 start pressure support  Consults:  GI >> s/o 1/05  Procedures:  ETT 1/1 >>  Rt PICC 1/2 >>   Significant Diagnostic Tests:  1/4 EGD >> 2 gastric ulcers clipped.  No high risk stigmata.  Micro Data:  SARS CoV2 12/30 >> positive Blood 12/30 >> negative C. difficile 12/31 >> negative  COVID Therapy:  Remdesivir 12/30 >> 1/03 Tocilizumab 12/30   Antimicrobials:  Azithromycin 12/31 >> 1/06 Ceftriaxone 12/31 >> 1/05  Interim history/subjective:  Pressure support  Objective   Blood pressure (!) 107/52, pulse 77, temperature 98.6 F (37 C), temperature source Oral, resp. rate (!) 34, height 5\' 3"  (1.6 m), weight 70.5 kg, SpO2 96 %.    Vent Mode: CPAP;PSV FiO2 (%):  [40 %] 40 % Set Rate:  [25 bmp] 25 bmp Vt Set:  [340 mL] 340 mL PEEP:  [8 cmH20] 8 cmH20 Pressure Support:  [12 cmH20] 12 cmH20 Plateau Pressure:  [18 cmH20-24 cmH20] 24 cmH20   Intake/Output Summary (Last 24 hours) at 03/30/2019 1105 Last data filed at 03/30/2019 1000 Gross per 24 hour  Intake 1786.61 ml  Output 1650 ml  Net 136.61 ml   Filed Weights   03/26/19 0333 03/29/19 0445 03/30/19 0500  Weight: 71.1 kg 70.2 kg 70.5 kg    Examination:  General - sedated Eyes - pupils reactive ENT - ETT in place Cardiac - regular rate/rhythm, no murmur Chest - scattered  rhonchi Abdomen - soft, non tender, + bowel sounds Extremities - no cyanosis, clubbing, or edema Skin - no rashes Neuro - RASS -2  CXR (reviewed by me) - decreased ASD  Resolved Hospital Problem list:    Assessment & Plan:   Acute hypoxic respiratory failure from COVID 19 pneumonia with ARDS with possible bacterial HCAP. - pressure support - f/u CXR - lasix 40 mg IV x one 1/09  ABLA from Gastric ulcer. - continue protonix - f/u CBC - transfuse for Hb < 7  Hx of HTN, HLD. - coreg, pravastatin  DM type 2. - SSI  Hypernatremia. - free water  Hypokalemia. - replace as needed  Acute metabolic encephalopathy from hypoxia. - RASS goal 0 to -1 - continue klonopin, MSIR, seroquel   Best practice:  DVT prophylaxis: lovenox SUP: protonix Nutrition: tube feeds Mobility: bed rest Goals of care: full code Disposition: ICU  Labs:   CMP Latest Ref Rng & Units 03/30/2019 03/30/2019 03/29/2019  Glucose 70 - 99 mg/dL 05/27/2019) - 500(X)  BUN 8 - 23 mg/dL 22 - 15  Creatinine 381(W - 1.24 mg/dL 2.99 - 3.71  Sodium 6.96 - 145 mmol/L 147(H) 147(H) 148(H)  Potassium 3.5 - 5.1 mmol/L 3.1(L) 3.1(L) 3.0(L)  Chloride 98 - 111 mmol/L 107 - 111  CO2 22 - 32 mmol/L 31 - 29  Calcium 8.9 - 10.3 mg/dL 7.7(L) - 7.6(L)  Total Protein 6.5 - 8.1 g/dL - - -  Total Bilirubin 0.3 - 1.2 mg/dL - - -  Alkaline Phos 38 - 126 U/L - - -  AST 15 - 41 U/L - - -  ALT 0 - 44 U/L - - -    CBC Latest Ref Rng & Units 03/30/2019 03/30/2019 03/29/2019  WBC 4.0 - 10.5 K/uL 5.4 - -  Hemoglobin 13.0 - 17.0 g/dL 10.4(L) 10.2(L) 10.3(L)  Hematocrit 39.0 - 52.0 % 31.8(L) 30.0(L) 32.5(L)  Platelets 150 - 400 K/uL 85(L) - -    ABG    Component Value Date/Time   PHART 7.531 (H) 03/30/2019 0156   PCO2ART 38.6 03/30/2019 0156   PO2ART 56.0 (L) 03/30/2019 0156   HCO3 32.3 (H) 03/30/2019 0156   TCO2 33 (H) 03/30/2019 0156   ACIDBASEDEF 2.0 03/27/2019 0300   O2SAT 92.0 03/30/2019 0156    CBG (last 3)  Recent Labs     03/29/19 2344 03/30/19 0422 03/30/19 0738  GLUCAP 258* 292* 315*    CC time 32 minutes  Chesley Mires, MD [place] 03/30/2019, 11:05 AM

## 2019-03-31 ENCOUNTER — Inpatient Hospital Stay (HOSPITAL_COMMUNITY): Payer: Medicare Other

## 2019-03-31 LAB — POCT I-STAT 7, (LYTES, BLD GAS, ICA,H+H)
Acid-Base Excess: 9 mmol/L — ABNORMAL HIGH (ref 0.0–2.0)
Bicarbonate: 33.9 mmol/L — ABNORMAL HIGH (ref 20.0–28.0)
Calcium, Ion: 1.21 mmol/L (ref 1.15–1.40)
HCT: 32 % — ABNORMAL LOW (ref 39.0–52.0)
Hemoglobin: 10.9 g/dL — ABNORMAL LOW (ref 13.0–17.0)
O2 Saturation: 87 %
Patient temperature: 98.6
Potassium: 4.5 mmol/L (ref 3.5–5.1)
Sodium: 146 mmol/L — ABNORMAL HIGH (ref 135–145)
TCO2: 35 mmol/L — ABNORMAL HIGH (ref 22–32)
pCO2 arterial: 47.4 mmHg (ref 32.0–48.0)
pH, Arterial: 7.462 — ABNORMAL HIGH (ref 7.350–7.450)
pO2, Arterial: 50 mmHg — ABNORMAL LOW (ref 83.0–108.0)

## 2019-03-31 LAB — HEMOGLOBIN AND HEMATOCRIT, BLOOD
HCT: 31.9 % — ABNORMAL LOW (ref 39.0–52.0)
Hemoglobin: 9.9 g/dL — ABNORMAL LOW (ref 13.0–17.0)

## 2019-03-31 LAB — GLUCOSE, CAPILLARY
Glucose-Capillary: 268 mg/dL — ABNORMAL HIGH (ref 70–99)
Glucose-Capillary: 337 mg/dL — ABNORMAL HIGH (ref 70–99)
Glucose-Capillary: 338 mg/dL — ABNORMAL HIGH (ref 70–99)
Glucose-Capillary: 292 mg/dL — ABNORMAL HIGH (ref 70–99)
Glucose-Capillary: 294 mg/dL — ABNORMAL HIGH (ref 70–99)
Glucose-Capillary: 263 mg/dL — ABNORMAL HIGH (ref 70–99)

## 2019-03-31 LAB — BASIC METABOLIC PANEL
Anion gap: 7 (ref 5–15)
BUN: 19 mg/dL (ref 8–23)
CO2: 30 mmol/L (ref 22–32)
Calcium: 6.8 mg/dL — ABNORMAL LOW (ref 8.9–10.3)
Chloride: 109 mmol/L (ref 98–111)
Creatinine, Ser: 0.73 mg/dL (ref 0.61–1.24)
GFR calc Af Amer: >60 mL/min (ref >60)
GFR calc non Af Amer: >60 mL/min (ref >60)
Glucose, Bld: 319 mg/dL — ABNORMAL HIGH (ref 70–99)
Potassium: 3.2 mmol/L — ABNORMAL LOW (ref 3.5–5.1)
Sodium: 146 mmol/L — ABNORMAL HIGH (ref 135–145)

## 2019-03-31 LAB — CBC
HCT: 24 % — ABNORMAL LOW (ref 39.0–52.0)
Hemoglobin: 7.3 g/dL — ABNORMAL LOW (ref 13.0–17.0)
MCH: 30.2 pg (ref 26.0–34.0)
MCHC: 30.4 g/dL (ref 30.0–36.0)
MCV: 99.2 fL (ref 80.0–100.0)
Platelets: 66 10*3/uL — ABNORMAL LOW (ref 150–400)
RBC: 2.42 MIL/uL — ABNORMAL LOW (ref 4.22–5.81)
RDW: 15.9 % — ABNORMAL HIGH (ref 11.5–15.5)
WBC: 4.1 10*3/uL (ref 4.0–10.5)
nRBC: 0 % (ref 0.0–0.2)

## 2019-03-31 LAB — MAGNESIUM: Magnesium: 1.7 mg/dL (ref 1.7–2.4)

## 2019-03-31 MED ORDER — PROPOFOL 1000 MG/100ML IV EMUL
INTRAVENOUS | Status: AC
  Administered 2019-03-31: 20 ug/kg/min via INTRAVENOUS
  Filled 2019-03-31: qty 100

## 2019-03-31 MED ORDER — POLYETHYLENE GLYCOL 3350 17 G PO PACK
17.0000 g | PACK | Freq: Every day | ORAL | Status: DC
  Administered 2019-03-31 – 2019-04-08 (×7): 17 g
  Filled 2019-03-31 (×7): qty 1

## 2019-03-31 MED ORDER — FENTANYL 2500MCG IN NS 250ML (10MCG/ML) PREMIX INFUSION
25.0000 ug/h | INTRAVENOUS | Status: DC
  Administered 2019-03-31: 23:00:00 100 ug/h via INTRAVENOUS
  Administered 2019-04-01 – 2019-04-04 (×8): 200 ug/h via INTRAVENOUS
  Administered 2019-04-05: 100 ug/h via INTRAVENOUS
  Administered 2019-04-05 – 2019-04-09 (×7): 200 ug/h via INTRAVENOUS
  Administered 2019-04-09: 175 ug/h via INTRAVENOUS
  Administered 2019-04-10 (×2): 200 ug/h via INTRAVENOUS
  Filled 2019-03-31 (×20): qty 250

## 2019-03-31 MED ORDER — FENTANYL BOLUS VIA INFUSION
25.0000 ug | INTRAVENOUS | Status: DC | PRN
  Administered 2019-03-31 – 2019-04-10 (×23): 25 ug via INTRAVENOUS
  Filled 2019-03-31: qty 25

## 2019-03-31 MED ORDER — BISACODYL 10 MG RE SUPP: 10.0000 mg | Freq: Every day | RECTAL | Status: DC | PRN

## 2019-03-31 MED ORDER — INSULIN DETEMIR 100 UNIT/ML ~~LOC~~ SOLN
20.0000 [IU] | Freq: Two times a day (BID) | SUBCUTANEOUS | Status: DC
  Administered 2019-03-31 – 2019-04-01 (×3): 20 [IU] via SUBCUTANEOUS
  Filled 2019-03-31 (×6): qty 0.2

## 2019-03-31 MED ORDER — PROPOFOL 1000 MG/100ML IV EMUL
0.0000 ug/kg/min | INTRAVENOUS | Status: DC
  Administered 2019-04-01: 30 ug/kg/min via INTRAVENOUS
  Filled 2019-03-31: qty 100

## 2019-03-31 MED ORDER — INSULIN ASPART 100 UNIT/ML ~~LOC~~ SOLN
0.0000 [IU] | SUBCUTANEOUS | Status: DC
  Administered 2019-03-31: 11 [IU] via SUBCUTANEOUS
  Administered 2019-03-31 (×2): 8 [IU] via SUBCUTANEOUS
  Administered 2019-04-01: 2 [IU] via SUBCUTANEOUS
  Administered 2019-04-01 (×2): 3 [IU] via SUBCUTANEOUS
  Administered 2019-04-01 (×2): 15 [IU] via SUBCUTANEOUS
  Administered 2019-04-02 (×2): 11 [IU] via SUBCUTANEOUS
  Administered 2019-04-02 (×4): 8 [IU] via SUBCUTANEOUS
  Administered 2019-04-02: 5 [IU] via SUBCUTANEOUS
  Administered 2019-04-03: 8 [IU] via SUBCUTANEOUS
  Administered 2019-04-03: 5 [IU] via SUBCUTANEOUS
  Administered 2019-04-03: 3 [IU] via SUBCUTANEOUS
  Administered 2019-04-03: 5 [IU] via SUBCUTANEOUS
  Administered 2019-04-03: 8 [IU] via SUBCUTANEOUS
  Administered 2019-04-04 – 2019-04-05 (×2): 3 [IU] via SUBCUTANEOUS
  Administered 2019-04-05: 08:00:00 5 [IU] via SUBCUTANEOUS
  Administered 2019-04-05: 3 [IU] via SUBCUTANEOUS

## 2019-03-31 MED ORDER — IOHEXOL 350 MG/ML SOLN
75.0000 mL | Freq: Once | INTRAVENOUS | Status: AC | PRN
  Administered 2019-03-31: 75 mL via INTRAVENOUS

## 2019-03-31 MED ORDER — POTASSIUM CHLORIDE 20 MEQ/15ML (10%) PO SOLN
40.0000 meq | Freq: Four times a day (QID) | ORAL | Status: AC
  Administered 2019-03-31 (×3): 40 meq
  Filled 2019-03-31 (×3): qty 30

## 2019-03-31 MED ORDER — FENTANYL CITRATE (PF) 100 MCG/2ML IJ SOLN
25.0000 ug | Freq: Once | INTRAMUSCULAR | Status: AC
  Administered 2019-03-31: 25 ug via INTRAVENOUS

## 2019-03-31 MED ORDER — FUROSEMIDE 10 MG/ML IJ SOLN
40.0000 mg | Freq: Once | INTRAMUSCULAR | Status: AC
  Administered 2019-03-31: 40 mg via INTRAVENOUS
  Filled 2019-03-31: qty 4

## 2019-03-31 MED ORDER — SODIUM CHLORIDE 0.9 % IV SOLN: INTRAVENOUS | Status: DC | PRN

## 2019-03-31 MED ORDER — NOREPINEPHRINE 4 MG/250ML-% IV SOLN
0.0000 ug/min | INTRAVENOUS | Status: DC
  Administered 2019-03-31: 5 ug/min via INTRAVENOUS
  Administered 2019-04-01: 02:00:00 24 ug/min via INTRAVENOUS
  Filled 2019-03-31 (×3): qty 250

## 2019-03-31 NOTE — Progress Notes (Signed)
Spoke with pts wife and updated about current status and treatment plan.  Coralyn Helling, MD [facility] Pulmonary/Critical Care 03/31/2019, 3:19 PM

## 2019-03-31 NOTE — Procedures (Signed)
Arterial Catheter Insertion Procedure Note Marc Young [id number] 01/24/1946  Procedure: Insertion of Arterial Catheter  Indications: Blood pressure monitoring and Frequent blood sampling  Procedure Details Consent: Risks of procedure as well as the alternatives and risks of each were explained to the (patient/caregiver).  Consent for procedure obtained. Time Out: Verified patient identification, verified procedure, site/side was marked, verified correct patient position, special equipment/implants available, medications/allergies/relevent history reviewed, required imaging and test results available.  Performed  Maximum sterile technique was used including antiseptics, cap, gloves, gown, hand hygiene, mask and sheet. Skin prep: Chlorhexidine; local anesthetic administered 20 gauge catheter was inserted into left radial artery using the Seldinger technique. ULTRASOUND GUIDANCE USED: NO Evaluation Blood flow good; BP tracing good. Complications: No apparent complications.  Placed with one attempt with two RT's at bedside. No complications noted   Tacy Learn 03/31/2019

## 2019-03-31 NOTE — Progress Notes (Signed)
eLink Physician-Brief Progress Note Patient Name: Marc Young DOB: Mar 08, 1946 MRN: [id number]   Date of Service  03/31/2019  HPI/Events of Note  ABG    Component Value Date/Time   PHART 7.462 (H) 03/31/2019 2115   PCO2ART 47.4 03/31/2019 2115   PO2ART 50.0 (L) 03/31/2019 2115   HCO3 33.9 (H) 03/31/2019 2115   TCO2 35 (H) 03/31/2019 2115   ACIDBASEDEF 2.0 03/27/2019 0300   O2SAT 87.0 03/31/2019 2115   Vent Mode: PRVC FiO2 (%):  [40 %-100 %] 100 % Set Rate:  [25 bmp] 25 bmp Vt Set:  [340 mL] 340 mL PEEP:  [8 cmH20-10 cmH20] 10 cmH20 Pressure Support:  [8 cmH20] 8 cmH20 Plateau Pressure:  [22 cmH20-29 cmH20] 29 cmH20  Vitals:   03/31/19 2030 03/31/19 2100  BP: (!) 133/57 (!) 120/53  Pulse: 81 91  Resp: (!) 29 (!) 36  Temp:    SpO2: 94% 92%   Patient is currently breathing 40x/min on the vent, with tidal volume of 6-7cc/kg. Cannot increase VT further due to Pplat 32 and patient requiring present level of PEEP to maintain sats of 88-90% on FiO2 1.0.  Of note, patient has NOT been on heparin ppx due to recent GI bleeding requiring endoscopy/clip placement.   eICU Interventions  The high PaCO2 on ABG despite the patient's severe tachypnea with adequate tidal volumes and high minute ventilation are concerning for a dramatic increase in dead space ventilation. CXR findings do not provide an adequate explanation for the sudden change we are seeing physiologically (relatively unchanged from prior; diffuse bilateral patchy opacities c/w COVID PNA).  I am concerned about a pulmonary embolism in this patient. We will obtain a CTA Chest to evaluate.  Given his history of GI bleeding in the past week requiring endoscopy and clip placement for two gastric ulcers (one with visible vessel), I will not start empiric heparin drip / anticoagulation prior to confirmation of PE. We will obtain the CTA STAT.      Intervention Category Major Interventions: Acid-Base disturbance - evaluation  and management;Respiratory failure - evaluation and management  Janae Bridgeman 03/31/2019, 9:31 PM

## 2019-03-31 NOTE — Progress Notes (Signed)
eLink Physician-Brief Progress Note Patient Name: Marc Young DOB: 12-27-1945 MRN: [id number]   Date of Service  03/31/2019  HPI/Events of Note  Worsened hypoxemia.  Sats dropped to mid 80s. FiO2 had to be increased from 70% to 100% and PEEP increased from 8 to 10 to obtain sats in low-mid 90s.  Unclear cause of relatively sudden increase in vent requirements.   eICU Interventions  Ordered CXR to further evaluate.     Intervention Category Major Interventions: Respiratory failure - evaluation and management  Janae Bridgeman 03/31/2019, 8:28 PM

## 2019-03-31 NOTE — Progress Notes (Signed)
Contacted Elink concerning decreased O2 sats and increasing vent settings. O2 sats around 85-88% despite giving 2 MG of versed, changing pulse ox probe site, and suctioning pt. CVP of 5. ETT remains at 24 at the lip. RT at bedside.  Vent settings increased from FIO2 70 and peep of 8 to FIO2 100 and peep of 10. O2 sats now 92-93%.

## 2019-03-31 NOTE — Progress Notes (Signed)
eLink Physician-Brief Progress Note Patient Name: Freeland Pracht DOB: [birth year] MRN: [id number]   Date of Service  03/31/2019  HPI/Events of Note  CTA showed no evidence of PE but showed severe multifocal lung consolidation with posterior/dependent area predominance.   eICU Interventions  Set RR on ventilator to 35/min, then we will paralyze and prone the patient in attempt to improve oxygenation and ventilation.  ABG 1 hour after proning.      Intervention Category Major Interventions: Respiratory failure - evaluation and management;Acid-Base disturbance - evaluation and management  Janae Bridgeman 03/31/2019, 10:53 PM

## 2019-03-31 NOTE — Progress Notes (Signed)
NAME:  Marc Young, MRN:  [id number], DOB:  Aug 06, 1945, LOS: 11 ADMISSION DATE:  Mar 25, 2019, CONSULTATION DATE: 1/1 REFERRING MD: Dr. Randol Kern, CHIEF COMPLAINT: Acute respiratory failure  Brief History   74 yo male developed viral respiratory symptoms from 12/23.  Found to have COVID 19 pneumonia.  Treated with steroids, remdesivir, tocilizumab.  Required intubation 1/01.  Past Medical History  CKD 1, DM, HLD, HTN  Significant Hospital Events   1/1 VDRF, start nimbex 1/3 Transfer to [facility] from [facility] for evaluation of GI bleed 1/4 underwent EGD with clip placed on gastric ulcer with visible vessel; transition off nimbex 1/9 start pressure support  Consults:  GI >> s/o 1/05  Procedures:  ETT 1/1 >>  Rt PICC 1/2 >>   Significant Diagnostic Tests:  1/4 EGD >> 2 gastric ulcers clipped.  No high risk stigmata.  Micro Data:  SARS CoV2 12/30 >> positive Blood 12/30 >> negative C. difficile 12/31 >> negative Sputum 1/10 >>  COVID Therapy:  Remdesivir 12/30 >> 1/03 Tocilizumab 12/30   Antimicrobials:  Azithromycin 12/31 >> 1/06 Ceftriaxone 12/31 >> 1/05  Interim history/subjective:  Tachypnea with PS.  Objective   Blood pressure 138/61, pulse 72, temperature 97.6 F (36.4 C), temperature source Axillary, resp. rate (!) 27, height 5\' 3"  (1.6 m), weight 70.1 kg, SpO2 94 %.    Vent Mode: PSV;CPAP FiO2 (%):  [40 %-60 %] 50 % Set Rate:  [25 bmp] 25 bmp Vt Set:  [340 mL] 340 mL PEEP:  [8 cmH20] 8 cmH20 Pressure Support:  [8 cmH20-12 cmH20] 8 cmH20 Plateau Pressure:  [22 cmH20] 22 cmH20   Intake/Output Summary (Last 24 hours) at 03/31/2019 05/29/2019 Last data filed at 03/31/2019 0700 Gross per 24 hour  Intake 2283.27 ml  Output 2595 ml  Net -311.73 ml   Filed Weights   03/29/19 0445 03/30/19 0500 03/31/19 0350  Weight: 70.2 kg 70.5 kg 70.1 kg    Examination:  General - sedated Eyes - pupils reactive ENT - ETT in place Cardiac - regular rate/rhythm, no  murmur Chest - b/l rales Abdomen - soft, non tender, hypoactive bowel sounds Extremities - no cyanosis, clubbing, or edema Skin - no rashes Neuro - RASS -2  CXR (reviewed by me) - increased b/l ASD  Resolved Hospital Problem list:    Assessment & Plan:   Acute hypoxic respiratory failure from COVID 19 pneumonia with ARDS with possible bacterial HCAP. - increased ASD on CXR 1/10 - lasix 40 mg IV x one on 1/10 - check sputum culture - not ready for extubation - f/u CXR - might need trach to assist with vent weaning  ABLA from Gastric ulcer. Constipation. - continue protonix - Hb drop 1/10 >> no signs of bleeding - f/u CBC - transfuse for Hb < 7 - if Hb drops further, then d/c lovenox - adjust bowel regimen  Hx of HTN, HLD. - coreg, pravastatin  DM type 2. - SSI - increase levemir to 10 units bid  Hypernatremia. - free water  Hypokalemia. - replace as needed  Acute metabolic encephalopathy from hypoxia. - RASS goal 0 to -1 - continue klonopin, MSIR, seroquel   Best practice:  DVT prophylaxis: lovenox SUP: protonix Nutrition: tube feeds Mobility: bed rest Goals of care: full code Disposition: ICU  Labs:   CMP Latest Ref Rng & Units 03/31/2019 03/30/2019 03/30/2019  Glucose 70 - 99 mg/dL 05/28/2019) 423(N) -  BUN 8 - 23 mg/dL 19 22 -  Creatinine 361(W - 1.24  mg/dL 0.73 0.96 -  Sodium 135 - 145 mmol/L 146(H) 147(H) 147(H)  Potassium 3.5 - 5.1 mmol/L 3.2(L) 3.1(L) 3.1(L)  Chloride 98 - 111 mmol/L 109 107 -  CO2 22 - 32 mmol/L 30 31 -  Calcium 8.9 - 10.3 mg/dL 6.8(L) 7.7(L) -  Total Protein 6.5 - 8.1 g/dL - - -  Total Bilirubin 0.3 - 1.2 mg/dL - - -  Alkaline Phos 38 - 126 U/L - - -  AST 15 - 41 U/L - - -  ALT 0 - 44 U/L - - -    CBC Latest Ref Rng & Units 03/31/2019 03/30/2019 03/30/2019  WBC 4.0 - 10.5 K/uL 4.1 5.4 -  Hemoglobin 13.0 - 17.0 g/dL 7.3(L) 10.4(L) 10.2(L)  Hematocrit 39.0 - 52.0 % 24.0(L) 31.8(L) 30.0(L)  Platelets 150 - 400 K/uL 66(L) 85(L) -     ABG    Component Value Date/Time   PHART 7.531 (H) 03/30/2019 0156   PCO2ART 38.6 03/30/2019 0156   PO2ART 56.0 (L) 03/30/2019 0156   HCO3 32.3 (H) 03/30/2019 0156   TCO2 33 (H) 03/30/2019 0156   ACIDBASEDEF 2.0 03/27/2019 0300   O2SAT 92.0 03/30/2019 0156    CBG (last 3)  Recent Labs    03/30/19 2349 03/31/19 0318 03/31/19 0815  GLUCAP 255* 268* 337*    CC time 32 minutes  Chesley Mires, MD [facility] 03/31/2019, 8:21 AM

## 2019-04-01 ENCOUNTER — Inpatient Hospital Stay (HOSPITAL_COMMUNITY): Payer: Medicare Other

## 2019-04-01 DIAGNOSIS — E8770 Fluid overload, unspecified: Secondary | ICD-10-CM

## 2019-04-01 LAB — GLUCOSE, CAPILLARY
Glucose-Capillary: 104 mg/dL — ABNORMAL HIGH (ref 70–99)
Glucose-Capillary: 148 mg/dL — ABNORMAL HIGH (ref 70–99)
Glucose-Capillary: 155 mg/dL — ABNORMAL HIGH (ref 70–99)
Glucose-Capillary: 165 mg/dL — ABNORMAL HIGH (ref 70–99)
Glucose-Capillary: 360 mg/dL — ABNORMAL HIGH (ref 70–99)
Glucose-Capillary: 378 mg/dL — ABNORMAL HIGH (ref 70–99)

## 2019-04-01 LAB — POCT I-STAT 7, (LYTES, BLD GAS, ICA,H+H)
Acid-Base Excess: 6 mmol/L — ABNORMAL HIGH (ref 0.0–2.0)
Acid-Base Excess: 6 mmol/L — ABNORMAL HIGH (ref 0.0–2.0)
Bicarbonate: 31 mmol/L — ABNORMAL HIGH (ref 20.0–28.0)
Bicarbonate: 32.6 mmol/L — ABNORMAL HIGH (ref 20.0–28.0)
Calcium, Ion: 1.16 mmol/L (ref 1.15–1.40)
Calcium, Ion: 1.18 mmol/L (ref 1.15–1.40)
HCT: 26 % — ABNORMAL LOW (ref 39.0–52.0)
HCT: 32 % — ABNORMAL LOW (ref 39.0–52.0)
Hemoglobin: 10.9 g/dL — ABNORMAL LOW (ref 13.0–17.0)
Hemoglobin: 8.8 g/dL — ABNORMAL LOW (ref 13.0–17.0)
O2 Saturation: 87 %
O2 Saturation: 95 %
Patient temperature: 102.8
Patient temperature: 98.6
Potassium: 4.8 mmol/L (ref 3.5–5.1)
Potassium: 5.1 mmol/L (ref 3.5–5.1)
Sodium: 142 mmol/L (ref 135–145)
Sodium: 145 mmol/L (ref 135–145)
TCO2: 32 mmol/L (ref 22–32)
TCO2: 34 mmol/L — ABNORMAL HIGH (ref 22–32)
pCO2 arterial: 49.7 mmHg — ABNORMAL HIGH (ref 32.0–48.0)
pCO2 arterial: 56.2 mmHg — ABNORMAL HIGH (ref 32.0–48.0)
pH, Arterial: 7.372 (ref 7.350–7.450)
pH, Arterial: 7.411 (ref 7.350–7.450)
pO2, Arterial: 60 mmHg — ABNORMAL LOW (ref 83.0–108.0)
pO2, Arterial: 81 mmHg — ABNORMAL LOW (ref 83.0–108.0)

## 2019-04-01 LAB — CBC
HCT: 34.7 % — ABNORMAL LOW (ref 39.0–52.0)
Hemoglobin: 10.8 g/dL — ABNORMAL LOW (ref 13.0–17.0)
MCH: 30.4 pg (ref 26.0–34.0)
MCHC: 31.1 g/dL (ref 30.0–36.0)
MCV: 97.7 fL (ref 80.0–100.0)
Platelets: 150 10*3/uL (ref 150–400)
RBC: 3.55 MIL/uL — ABNORMAL LOW (ref 4.22–5.81)
RDW: 16.3 % — ABNORMAL HIGH (ref 11.5–15.5)
WBC: 11.4 10*3/uL — ABNORMAL HIGH (ref 4.0–10.5)
nRBC: 0 % (ref 0.0–0.2)

## 2019-04-01 LAB — PHOSPHORUS: Phosphorus: 2.2 mg/dL — ABNORMAL LOW (ref 2.5–4.6)

## 2019-04-01 LAB — BASIC METABOLIC PANEL
Anion gap: 8 (ref 5–15)
BUN: 25 mg/dL — ABNORMAL HIGH (ref 8–23)
CO2: 29 mmol/L (ref 22–32)
Calcium: 8 mg/dL — ABNORMAL LOW (ref 8.9–10.3)
Chloride: 109 mmol/L (ref 98–111)
Creatinine, Ser: 1.02 mg/dL (ref 0.61–1.24)
GFR calc Af Amer: >60 mL/min (ref >60)
GFR calc non Af Amer: >60 mL/min (ref >60)
Glucose, Bld: 167 mg/dL — ABNORMAL HIGH (ref 70–99)
Potassium: 5.1 mmol/L (ref 3.5–5.1)
Sodium: 146 mmol/L — ABNORMAL HIGH (ref 135–145)

## 2019-04-01 LAB — POCT I-STAT EG7
Acid-Base Excess: 7 mmol/L — ABNORMAL HIGH (ref 0.0–2.0)
Bicarbonate: 30.6 mmol/L — ABNORMAL HIGH (ref 20.0–28.0)
Calcium, Ion: 1.18 mmol/L (ref 1.15–1.40)
HCT: 30 % — ABNORMAL LOW (ref 39.0–52.0)
Hemoglobin: 10.2 g/dL — ABNORMAL LOW (ref 13.0–17.0)
O2 Saturation: 92 %
Patient temperature: 102.7
Potassium: 4.8 mmol/L (ref 3.5–5.1)
Sodium: 145 mmol/L (ref 135–145)
TCO2: 32 mmol/L (ref 22–32)
pCO2, Ven: 44.7 mmHg (ref 44.0–60.0)
pH, Ven: 7.453 — ABNORMAL HIGH (ref 7.250–7.430)
pO2, Ven: 68 mmHg — ABNORMAL HIGH (ref 32.0–45.0)

## 2019-04-01 LAB — MAGNESIUM: Magnesium: 1.8 mg/dL (ref 1.7–2.4)

## 2019-04-01 LAB — TRIGLYCERIDES: Triglycerides: 170 mg/dL — ABNORMAL HIGH (ref <150)

## 2019-04-01 MED ORDER — VECURONIUM BROMIDE 10 MG IV SOLR
0.0500 mg/kg | Freq: Once | INTRAVENOUS | Status: AC
  Administered 2019-04-01: 01:00:00 3.5 mg via INTRAVENOUS

## 2019-04-01 MED ORDER — SODIUM CHLORIDE 0.9 % IV SOLN
2.0000 g | Freq: Two times a day (BID) | INTRAVENOUS | Status: DC
  Administered 2019-04-01 – 2019-04-05 (×10): 2 g via INTRAVENOUS
  Filled 2019-04-01 (×10): qty 2

## 2019-04-01 MED ORDER — CISATRACURIUM BESYLATE (PF) 10 MG/5ML IV SOLN
10.0000 mg | INTRAVENOUS | Status: AC
  Administered 2019-04-01: 10 mg via INTRAVENOUS
  Filled 2019-04-01: qty 5

## 2019-04-01 MED ORDER — NOREPINEPHRINE 16 MG/250ML-% IV SOLN
0.0000 ug/kg/min | INTRAVENOUS | Status: DC
  Filled 2019-04-01: qty 250

## 2019-04-01 MED ORDER — MIDAZOLAM 50MG/50ML (1MG/ML) PREMIX INFUSION
0.0000 mg/h | INTRAVENOUS | Status: DC
  Administered 2019-04-01: 10 mg/h via INTRAVENOUS
  Administered 2019-04-01: 2 mg/h via INTRAVENOUS
  Administered 2019-04-01 – 2019-04-02 (×2): 10 mg/h via INTRAVENOUS
  Administered 2019-04-02: 5 mg/h via INTRAVENOUS
  Administered 2019-04-02: 8 mg/h via INTRAVENOUS
  Administered 2019-04-02 – 2019-04-03 (×4): 10 mg/h via INTRAVENOUS
  Administered 2019-04-03: 8 mg/h via INTRAVENOUS
  Administered 2019-04-03 – 2019-04-05 (×6): 10 mg/h via INTRAVENOUS
  Administered 2019-04-05: 5 mg/h via INTRAVENOUS
  Administered 2019-04-05 – 2019-04-07 (×8): 10 mg/h via INTRAVENOUS
  Filled 2019-04-01 (×26): qty 50

## 2019-04-01 MED ORDER — METOLAZONE 5 MG PO TABS
10.0000 mg | ORAL_TABLET | Freq: Once | ORAL | Status: AC
  Administered 2019-04-01: 10 mg via ORAL
  Filled 2019-04-01: qty 2

## 2019-04-01 MED ORDER — ALBUMIN HUMAN 25 % IV SOLN
25.0000 g | Freq: Four times a day (QID) | INTRAVENOUS | Status: AC
  Administered 2019-04-01 – 2019-04-02 (×4): 25 g via INTRAVENOUS
  Filled 2019-04-01 (×4): qty 100

## 2019-04-01 MED ORDER — NOREPINEPHRINE 16 MG/250ML-% IV SOLN: 0.0000 ug/min | INTRAVENOUS | Status: DC

## 2019-04-01 MED ORDER — MIDAZOLAM BOLUS VIA INFUSION
1.0000 mg | INTRAVENOUS | Status: DC | PRN
  Administered 2019-04-01 – 2019-04-07 (×7): 2 mg via INTRAVENOUS
  Filled 2019-04-01: qty 2

## 2019-04-01 MED ORDER — VANCOMYCIN HCL 1500 MG/300ML IV SOLN
1500.0000 mg | Freq: Once | INTRAVENOUS | Status: AC
  Administered 2019-04-01: 1500 mg via INTRAVENOUS
  Filled 2019-04-01: qty 300

## 2019-04-01 MED ORDER — VASOPRESSIN 20 UNIT/ML IV SOLN
0.0300 [IU]/min | INTRAVENOUS | Status: DC
  Administered 2019-04-01: 04:00:00 0.03 [IU]/min via INTRAVENOUS
  Filled 2019-04-01: qty 2

## 2019-04-01 MED ORDER — VANCOMYCIN HCL 1250 MG/250ML IV SOLN
1250.0000 mg | INTRAVENOUS | Status: DC
  Administered 2019-04-02: 1250 mg via INTRAVENOUS
  Filled 2019-04-01: qty 250

## 2019-04-01 MED ORDER — CISATRACURIUM BOLUS VIA INFUSION: 10.0000 mg | Freq: Once | INTRAVENOUS | Status: DC

## 2019-04-01 MED ORDER — FUROSEMIDE 10 MG/ML IJ SOLN
40.0000 mg | Freq: Four times a day (QID) | INTRAMUSCULAR | Status: AC
  Administered 2019-04-01 – 2019-04-02 (×3): 40 mg via INTRAVENOUS
  Filled 2019-04-01 (×3): qty 4

## 2019-04-01 MED ORDER — SODIUM CHLORIDE 0.9 % IV SOLN
2.0000 g | Freq: Once | INTRAVENOUS | Status: AC
  Administered 2019-04-01: 04:00:00 2 g via INTRAVENOUS
  Filled 2019-04-01: qty 2

## 2019-04-01 MED ORDER — NOREPINEPHRINE 16 MG/250ML-% IV SOLN
0.0000 ug/min | INTRAVENOUS | Status: DC
  Administered 2019-04-01: 8 ug/min via INTRAVENOUS
  Administered 2019-04-01: 40 ug/min via INTRAVENOUS
  Administered 2019-04-02: 12 ug/min via INTRAVENOUS
  Filled 2019-04-01: qty 250

## 2019-04-01 MED ORDER — VECURONIUM BROMIDE 10 MG IV SOLR
INTRAVENOUS | Status: AC
  Filled 2019-04-01: qty 10

## 2019-04-01 NOTE — Progress Notes (Signed)
Pharmacy Antibiotic Note  Marc Young is a 74 y.o. male admitted on 03/17/2019 with Covid-19, now w/ concern for HCAP given fevers and hypotension.  Pharmacy has been consulted for vancomycin and cefepime dosing.  Plan: Vancomycin 1500mg  x1 then 1250mg  IV Q24H. Goal AUC 400-550.  Expected AUC 510.  SCr used 1.  Cefepime 2g IV Q12H.  Height: 5\' 3"  (160 cm) Weight: 154 lb 8.7 oz (70.1 kg) IBW/kg (Calculated) : 56.9  Temp (24hrs), Avg:99.6 F (37.6 C), Min:97.4 F (36.3 C), Max:102.8 F (39.3 C)  Recent Labs  Lab 03/26/19 0317 03/27/19 0447 03/28/19 0433 03/29/19 0436 03/30/19 0600 03/31/19 0337 04/01/19 0252  WBC 10.9* 9.7  --   --  5.4 4.1 11.4*  CREATININE 1.03 1.10 0.86 0.75 0.96 0.73 1.02    Estimated Creatinine Clearance: 56.7 mL/min (by C-G formula based on SCr of 1.02 mg/dL).    Allergies  Allergen Reactions  . Benazepril Cough  . Hydrochlorothiazide Other (See Comments)    SIADH hyponatremia  . Lisinopril Cough  . Losartan Potassium Cough     Thank you for allowing pharmacy to be a part of this patient's care.  05/28/19, PharmD, BCPS  04/01/2019 4:53 AM

## 2019-04-01 NOTE — Progress Notes (Signed)
RT NOTE:  RT proned patient. Dressing placed on face to prevent breakdown. Tube holder removed and ETT taped at the center and is at 24. Patients head is turned to the RIGHT.

## 2019-04-01 NOTE — Progress Notes (Signed)
03/31/2019 @ 2300: Called pts wife Liborio Nixon updated her on events of the night. Informed Liborio Nixon that we would be proning her husband and explained what that meant. Also got verbal consent for arterial line with Aniceto Boss, Char[employer] fundraiser.  Told wife after RN would call after pt is proned and let her know how he did. Support given over the phone.  Called pts wife after pt proned and updated her. Questions and concerns answered.

## 2019-04-01 NOTE — Progress Notes (Signed)
Pt. returned to supine position with the help of two RTs and 4 RNs at bedside. Pt.'s lines and tubes appear unchanged and vitals stable at this time.

## 2019-04-01 NOTE — Progress Notes (Signed)
eLink Physician-Brief Progress Note Patient Name: Marc Young DOB: 10-25-1945 MRN: [id number]   Date of Service  04/01/2019  HPI/Events of Note  MAP 63 by arterial line on levophed at 30 mcg/min.   eICU Interventions  Add vasopressin.  Increase upper limit of levophed to 0.7 mcg/kg/min.  Ordered antibiotics to cover for HCAP given high fever. Has GPCs in trach aspirate gram stain. Will give vanc/cefepime for now.  ABG at 5AM.     Intervention Category Major Interventions: Hypotension - evaluation and management  Marc Young 04/01/2019, 3:55 AM

## 2019-04-01 NOTE — Progress Notes (Signed)
NAME:  Joevon Holliman, MRN:  [id number], DOB:  1945-12-25, LOS: 12 ADMISSION DATE:  03/01/2019, CONSULTATION DATE: 1/1 REFERRING MD: Dr. Waldron Labs, CHIEF COMPLAINT: Acute respiratory failure  Brief History   74 yo male developed viral respiratory symptoms from 12/23.  Found to have COVID 19 pneumonia.  Treated with steroids, remdesivir, tocilizumab.  Required intubation 1/01.  Past Medical History  CKD 1, DM, HLD, HTN  Significant Hospital Events   1/1 VDRF, start nimbex 1/3 Transfer to [facility] for evaluation of GI bleed 1/4 underwent EGD with clip placed on gastric ulcer with visible vessel; transition off nimbex 1/9 start pressure support  Consults:  GI >> s/o 1/05  Procedures:  ETT 1/1 >>  Rt PICC 1/2 >>   Significant Diagnostic Tests:  1/4 EGD >> 2 gastric ulcers clipped.  No high risk stigmata.  Micro Data:  SARS CoV2 12/30 >> positive Blood 12/30 >> negative C. difficile 12/31 >> negative Sputum 1/10 >>  COVID Therapy:  Remdesivir 12/30 >> 1/03 Tocilizumab 12/30   Antimicrobials:  Azithromycin 12/31 >> 1/06 Ceftriaxone 12/31 >> 1/05  Interim history/subjective:  No events overnight, no new complaints  Objective   Blood pressure (!) 102/48, pulse 63, temperature 97.8 F (36.6 C), temperature source Axillary, resp. rate (!) 32, height 5\' 3"  (1.6 m), weight 70.1 kg, SpO2 95 %.    Vent Mode: PRVC FiO2 (%):  [70 %-100 %] 100 % Set Rate:  [25 bmp-35 bmp] 32 bmp Vt Set:  [340 mL] 340 mL PEEP:  [8 cmH20-14 cmH20] 14 cmH20 Plateau Pressure:  [24 cmH20-29 cmH20] 26 cmH20   Intake/Output Summary (Last 24 hours) at 04/01/2019 1034 Last data filed at 04/01/2019 5102 Gross per 24 hour  Intake 2801.27 ml  Output 1920 ml  Net 881.27 ml   Filed Weights   03/29/19 0445 03/30/19 0500 03/31/19 0350  Weight: 70.2 kg 70.5 kg 70.1 kg    Examination:  General - Acutely ill appearing male, NAD, sedate HEENT: [place]/AT, PERRL, EOM-I and MMM, ETT in  place Cardiac - RRR, Nl S1/S2 and -M/R/G Chest - Diffuse crackles Abdomen - Soft, NT, ND and +BS Extremities - no cyanosis, clubbing, or edema Skin - no rashes Neuro - Sedate, not withdrawing ext to command but is to pain  I reviewed CXR myself, ETT is in a good position. LLL consolidation  Resolved Hospital Problem list:    Assessment & Plan:   Acute hypoxic respiratory failure from COVID 19 pneumonia with ARDS with possible bacterial HCAP. - Lasix 40 mg IV q6 x3 doses - Zaroxolyn 10 mg PO x1 - Resume TF - Continue free water - BMET in AM - Replace electrolytes as indicated - Will need tracheostomy but unsafe to do at this time - Check sputum culture - Not ready for extubation - f/u CXR  ABLA from Gastric ulcer. Constipation. - Continue protonix - Hb drop 1/10 >> no signs of bleeding - F/u CBC - Transfuse for Hb < 7 - If Hb drops further, then d/c lovenox - Adjust bowel regimen  Hx of HTN, HLD. - coreg, pravastatin  DM type 2. - SSI - Increase levemir to 10 units bid  Hypernatremia. - Free water  Hypokalemia. - Replace as needed  Acute metabolic encephalopathy from hypoxia. - RASS goal 0 to -1 - Continue klonopin, MSIR, seroquel  Wife updated over the phone  Best practice:  DVT prophylaxis: lovenox SUP: protonix Nutrition: tube feeds Mobility: bed rest Goals of care: full code  Disposition: ICU  Labs:   CMP Latest Ref Rng & Units 04/01/2019 04/01/2019 04/01/2019  Glucose 70 - 99 mg/dL - 518(A) -  BUN 8 - 23 mg/dL - 41(Y) -  Creatinine 6.06 - 1.24 mg/dL - 3.01 -  Sodium 601 - 145 mmol/L 145 146(H) 145  Potassium 3.5 - 5.1 mmol/L 4.8 5.1 5.1  Chloride 98 - 111 mmol/L - 109 -  CO2 22 - 32 mmol/L - 29 -  Calcium 8.9 - 10.3 mg/dL - 8.0(L) -  Total Protein 6.5 - 8.1 g/dL - - -  Total Bilirubin 0.3 - 1.2 mg/dL - - -  Alkaline Phos 38 - 126 U/L - - -  AST 15 - 41 U/L - - -  ALT 0 - 44 U/L - - -    CBC Latest Ref Rng & Units 04/01/2019 04/01/2019  04/01/2019  WBC 4.0 - 10.5 K/uL - 11.4(H) -  Hemoglobin 13.0 - 17.0 g/dL 10.2(L) 10.8(L) 10.9(L)  Hematocrit 39.0 - 52.0 % 30.0(L) 34.7(L) 32.0(L)  Platelets 150 - 400 K/uL - 150 -    ABG    Component Value Date/Time   PHART 7.411 04/01/2019 0048   PCO2ART 49.7 (H) 04/01/2019 0048   PO2ART 60.0 (L) 04/01/2019 0048   HCO3 30.6 (H) 04/01/2019 0419   TCO2 32 04/01/2019 0419   ACIDBASEDEF 2.0 03/27/2019 0300   O2SAT 92.0 04/01/2019 0419    CBG (last 3)  Recent Labs    04/01/19 0046 04/01/19 0255 04/01/19 0825  GLUCAP 165* 155* 104*   The patient is critically ill with multiple organ systems failure and requires high complexity decision making for assessment and support, frequent evaluation and titration of therapies, application of advanced monitoring technologies and extensive interpretation of multiple databases.   Critical Care Time devoted to patient care services described in this note is  32  Minutes. This time reflects time of care of this signee Dr Koren Bound. This critical care time does not reflect procedure time, or teaching time or supervisory time of PA/NP/Med student/Med Resident etc but could involve care discussion time.  Alyson Reedy, M.D. [facility] Pulmonary/Critical Care Medicine.

## 2019-04-01 NOTE — Progress Notes (Signed)
Attempted to reposition pts head to left with RT and another RN. Pt had a vagal episode and dropped his HR to 51. Also his O2 sats went to 81% from 88%. Pts neck is very stiff and unable to turn neck to left. Pts head turned back to right and pt repositioned with pillows. No skin break down noted.

## 2019-04-01 NOTE — Progress Notes (Signed)
Patient supine at 1740.  Tolerated well.

## 2019-04-01 NOTE — Progress Notes (Signed)
20 mls of Morphine wasted in the sink with Rise Paganini, RN.

## 2019-04-02 ENCOUNTER — Inpatient Hospital Stay (HOSPITAL_COMMUNITY): Payer: Medicare Other

## 2019-04-02 DIAGNOSIS — I959 Hypotension, unspecified: Secondary | ICD-10-CM

## 2019-04-02 DIAGNOSIS — G9340 Encephalopathy, unspecified: Secondary | ICD-10-CM

## 2019-04-02 LAB — CBC
HCT: 25.8 % — ABNORMAL LOW (ref 39.0–52.0)
Hemoglobin: 7.9 g/dL — ABNORMAL LOW (ref 13.0–17.0)
MCH: 30.5 pg (ref 26.0–34.0)
MCHC: 30.6 g/dL (ref 30.0–36.0)
MCV: 99.6 fL (ref 80.0–100.0)
Platelets: 95 10*3/uL — ABNORMAL LOW (ref 150–400)
RBC: 2.59 MIL/uL — ABNORMAL LOW (ref 4.22–5.81)
RDW: 16.1 % — ABNORMAL HIGH (ref 11.5–15.5)
WBC: 9.4 10*3/uL (ref 4.0–10.5)
nRBC: 0 % (ref 0.0–0.2)

## 2019-04-02 LAB — BASIC METABOLIC PANEL
Anion gap: 10 (ref 5–15)
BUN: 43 mg/dL — ABNORMAL HIGH (ref 8–23)
CO2: 32 mmol/L (ref 22–32)
Calcium: 8.4 mg/dL — ABNORMAL LOW (ref 8.9–10.3)
Chloride: 102 mmol/L (ref 98–111)
Creatinine, Ser: 1.43 mg/dL — ABNORMAL HIGH (ref 0.61–1.24)
GFR calc Af Amer: 56 mL/min — ABNORMAL LOW (ref >60)
GFR calc non Af Amer: 48 mL/min — ABNORMAL LOW (ref >60)
Glucose, Bld: 276 mg/dL — ABNORMAL HIGH (ref 70–99)
Potassium: 3.7 mmol/L (ref 3.5–5.1)
Sodium: 144 mmol/L (ref 135–145)

## 2019-04-02 LAB — GLUCOSE, CAPILLARY
Glucose-Capillary: 280 mg/dL — ABNORMAL HIGH (ref 70–99)
Glucose-Capillary: 250 mg/dL — ABNORMAL HIGH (ref 70–99)
Glucose-Capillary: 295 mg/dL — ABNORMAL HIGH (ref 70–99)
Glucose-Capillary: 324 mg/dL — ABNORMAL HIGH (ref 70–99)
Glucose-Capillary: 348 mg/dL — ABNORMAL HIGH (ref 70–99)
Glucose-Capillary: 294 mg/dL — ABNORMAL HIGH (ref 70–99)
Glucose-Capillary: 278 mg/dL — ABNORMAL HIGH (ref 70–99)

## 2019-04-02 LAB — TRIGLYCERIDES: Triglycerides: 176 mg/dL — ABNORMAL HIGH (ref <150)

## 2019-04-02 LAB — MAGNESIUM: Magnesium: 2 mg/dL (ref 1.7–2.4)

## 2019-04-02 LAB — PHOSPHORUS: Phosphorus: 5.1 mg/dL — ABNORMAL HIGH (ref 2.5–4.6)

## 2019-04-02 MED ORDER — VANCOMYCIN VARIABLE DOSE PER UNSTABLE RENAL FUNCTION (PHARMACIST DOSING): Status: DC

## 2019-04-02 MED ORDER — CHLORHEXIDINE GLUCONATE CLOTH 2 % EX PADS
6.0000 | MEDICATED_PAD | Freq: Every day | CUTANEOUS | Status: DC
  Administered 2019-04-03 – 2019-04-09 (×6): 6 via TOPICAL

## 2019-04-02 MED ORDER — ALBUMIN HUMAN 25 % IV SOLN
25.0000 g | Freq: Four times a day (QID) | INTRAVENOUS | Status: AC
  Administered 2019-04-02 – 2019-04-03 (×4): 25 g via INTRAVENOUS
  Filled 2019-04-02 (×4): qty 100

## 2019-04-02 MED ORDER — METOLAZONE 5 MG PO TABS
10.0000 mg | ORAL_TABLET | Freq: Once | ORAL | Status: AC
  Administered 2019-04-02: 13:00:00 10 mg via ORAL
  Filled 2019-04-02: qty 2

## 2019-04-02 MED ORDER — INSULIN DETEMIR 100 UNIT/ML ~~LOC~~ SOLN
30.0000 [IU] | Freq: Two times a day (BID) | SUBCUTANEOUS | Status: DC
  Administered 2019-04-02 – 2019-04-03 (×4): 30 [IU] via SUBCUTANEOUS
  Filled 2019-04-02 (×6): qty 0.3

## 2019-04-02 MED ORDER — FUROSEMIDE 10 MG/ML IJ SOLN
40.0000 mg | Freq: Four times a day (QID) | INTRAMUSCULAR | Status: AC
  Administered 2019-04-02 (×3): 40 mg via INTRAVENOUS
  Filled 2019-04-02 (×3): qty 4

## 2019-04-02 NOTE — Progress Notes (Signed)
RT note: patient does not meet SBT criteria this AM due to PEEP and FIO2 requirements.  Tolerating current ventilator settings well.  Will continue to monitor.  

## 2019-04-02 NOTE — Progress Notes (Signed)
RT NOTE:  ETT secured with holder. RN assisted with change. ETT secured throughout

## 2019-04-02 NOTE — Progress Notes (Signed)
eLink Physician-Brief Progress Note Patient Name: Marc Young DOB: 1945-04-13 MRN: [id number]   Date of Service  04/02/2019  HPI/Events of Note  Pt was proned yesterday for hypoxemia but saturation has been 100% since this morning.  eICU Interventions  Defer proning for now and continue to closely monitor patient,        Migdalia Dk 04/02/2019, 1:10 AM

## 2019-04-02 NOTE — Progress Notes (Signed)
Inpatient Diabetes Program Recommendations  AACE/ADA: New Consensus Statement on Inpatient Glycemic Control (2015)  Target Ranges:  Prepandial:   less than 140 mg/dL      Peak postprandial:   less than 180 mg/dL (1-2 hours)      Critically ill patients:  140 - 180 mg/dL   Lab Results  Component Value Date   GLUCAP 295 (H) 04/02/2019   HGBA1C 9.8 (H) 03/10/2019    Review of Glycemic Control  Blood sugars above goal of 140-180 mg/dL. Would likely benefit from adding Novolog TF coverage.  Inpatient Diabetes Program Recommendations:     Add Novolog 5 units Q4H for TF coverage. Do not give if TF on Hold.  Will continue to follow daily.  Thank you. Ailene Ards, RD, LDN, CDE Inpatient Diabetes Coordinator [phone]

## 2019-04-02 NOTE — Progress Notes (Signed)
NAME:  Marc Young, MRN:  [id number], DOB:  [birth year], LOS: 40 ADMISSION DATE:  04/11/2019, CONSULTATION DATE: 1/1 REFERRING MD: Dr. Waldron Labs, CHIEF COMPLAINT: Acute respiratory failure  Brief History   74 yo male developed viral respiratory symptoms from 12/23.  Found to have COVID 19 pneumonia.  Treated with steroids, remdesivir, tocilizumab.  Required intubation 1/01.  Past Medical History  CKD 1, DM, HLD, HTN  Significant Hospital Events   1/1 VDRF, start nimbex 1/3 Transfer to [facility] from [facility] for evaluation of GI bleed 1/4 underwent EGD with clip placed on gastric ulcer with visible vessel; transition off nimbex 1/9 start pressure support  Consults:  GI >> s/o 1/05  Procedures:  ETT 1/1 >>  Rt PICC 1/2 >>   Significant Diagnostic Tests:  1/4 EGD >> 2 gastric ulcers clipped.  No high risk stigmata.  Micro Data:  SARS CoV2 12/30 >> positive Blood 12/30 >> negative C. difficile 12/31 >> negative Sputum 1/10 >>  COVID Therapy:  Remdesivir 12/30 >> 1/03 Tocilizumab 12/30   Antimicrobials:  Azithromycin 12/31 >> 1/06 Ceftriaxone 12/31 >> 1/05  Interim history/subjective:  Proning deferred overnight due to improved oxygenation No further events  Objective   Blood pressure (!) 153/35, pulse 71, temperature (!) 97.5 F (36.4 C), temperature source Axillary, resp. rate (!) 32, height 5\' 3"  (1.6 m), weight 65.6 kg, SpO2 100 %.    Vent Mode: PRVC FiO2 (%):  [90 %-100 %] 90 % Set Rate:  [32 bmp] 32 bmp Vt Set:  [340 mL] 340 mL PEEP:  [12 cmH20-14 cmH20] 12 cmH20 Plateau Pressure:  [24 cmH20-26 cmH20] 26 cmH20   Intake/Output Summary (Last 24 hours) at 04/02/2019 0740 Last data filed at 04/02/2019 0700 Gross per 24 hour  Intake 3076.89 ml  Output 3926 ml  Net -849.11 ml   Filed Weights   03/30/19 0500 03/31/19 0350 04/02/19 0431  Weight: 70.5 kg 70.1 kg 65.6 kg    Examination:  General - Acutely ill appearing male, NAD, sedate HEENT: [place]/AT,  PERRL, EOM-I and MMM, ETT in place Cardiac - RRR, Nl S1/S2 and -M/R/G Chest - Coarse diffusely Abdomen - Soft, NT, ND and +BS Extremities - no cyanosis, clubbing, or edema Skin - no rashes Neuro - Sedate, withdraws to pain  I reviewed CXR myself, bibasilar L>R infiltrate noted, ETT is ok  Discussed with bedside RN and RT  Resolved Hospital Problem list:    Assessment & Plan:   Acute hypoxic respiratory failure from COVID 19 pneumonia with ARDS with possible bacterial HCAP. - Lasix 40 mg IV q6 x3 doses - Zaroxolyn 10 mg PO x1 - Albumin 25 g q6 x4 doses - Continue free water at current dose - BMET in AM - Replace electrolytes as indicated - Will need trach but unsafe to perform given high FiO2 and PEEP demands for now - Check sputum culture - Not ready for extubation - f/u CXR  ABLA from Gastric ulcer. Constipation. - Continue protonix for now - Hb drop 1/10 >> no signs of bleeding - F/u CBC - Transfuse if <7 - If Hb drops further, then d/c lovenox - Adjust bowel regimen  Hx of HTN, HLD. - Hold Coreg and pravastatin for now  DM type 2. - SSI - Continue Levemir at 10 BID  Hypernatremia. - Improving but with diureses will continue free water  Hypokalemia. - Replace as needed  Acute metabolic encephalopathy from hypoxia. - RASS goal 0 to -1 - Continue klonopin, MSIR, seroquel  Wife updated over the phone  Best practice:  DVT prophylaxis: lovenox SUP: protonix Nutrition: tube feeds Mobility: bed rest Goals of care: full code Disposition: ICU  Labs:   CMP Latest Ref Rng & Units 04/02/2019 04/01/2019 04/01/2019  Glucose 70 - 99 mg/dL 716(R) - -  BUN 8 - 23 mg/dL 67(E) - -  Creatinine 9.38 - 1.24 mg/dL 1.01(B) - -  Sodium 510 - 145 mmol/L 144 142 145  Potassium 3.5 - 5.1 mmol/L 3.7 4.8 4.8  Chloride 98 - 111 mmol/L 102 - -  CO2 22 - 32 mmol/L 32 - -  Calcium 8.9 - 10.3 mg/dL 2.5(E) - -  Total Protein 6.5 - 8.1 g/dL - - -  Total Bilirubin 0.3 - 1.2  mg/dL - - -  Alkaline Phos 38 - 126 U/L - - -  AST 15 - 41 U/L - - -  ALT 0 - 44 U/L - - -    CBC Latest Ref Rng & Units 04/02/2019 04/01/2019 04/01/2019  WBC 4.0 - 10.5 K/uL 9.4 - -  Hemoglobin 13.0 - 17.0 g/dL 7.9(L) 8.8(L) 10.2(L)  Hematocrit 39.0 - 52.0 % 25.8(L) 26.0(L) 30.0(L)  Platelets 150 - 400 K/uL 95(L) - -    ABG    Component Value Date/Time   PHART 7.372 04/01/2019 1848   PCO2ART 56.2 (H) 04/01/2019 1848   PO2ART 81.0 (L) 04/01/2019 1848   HCO3 32.6 (H) 04/01/2019 1848   TCO2 34 (H) 04/01/2019 1848   ACIDBASEDEF 2.0 03/27/2019 0300   O2SAT 95.0 04/01/2019 1848    CBG (last 3)  Recent Labs    04/02/19 0027 04/02/19 0311 04/02/19 0737  GLUCAP 280* 250* 295*   The patient is critically ill with multiple organ systems failure and requires high complexity decision making for assessment and support, frequent evaluation and titration of therapies, application of advanced monitoring technologies and extensive interpretation of multiple databases.   Critical Care Time devoted to patient care services described in this note is  32  Minutes. This time reflects time of care of this signee Dr Koren Bound. This critical care time does not reflect procedure time, or teaching time or supervisory time of PA/NP/Med student/Med Resident etc but could involve care discussion time.  Alyson Reedy, M.D. [facility] Pulmonary/Critical Care Medicine.

## 2019-04-03 ENCOUNTER — Inpatient Hospital Stay (HOSPITAL_COMMUNITY): Payer: Medicare Other

## 2019-04-03 LAB — POCT I-STAT 7, (LYTES, BLD GAS, ICA,H+H)
Acid-Base Excess: 6 mmol/L — ABNORMAL HIGH (ref 0.0–2.0)
Acid-Base Excess: 8 mmol/L — ABNORMAL HIGH (ref 0.0–2.0)
Bicarbonate: 31.3 mmol/L — ABNORMAL HIGH (ref 20.0–28.0)
Bicarbonate: 32.6 mmol/L — ABNORMAL HIGH (ref 20.0–28.0)
Calcium, Ion: 1.15 mmol/L (ref 1.15–1.40)
Calcium, Ion: 1.23 mmol/L (ref 1.15–1.40)
HCT: 19 % — ABNORMAL LOW (ref 39.0–52.0)
HCT: 26 % — ABNORMAL LOW (ref 39.0–52.0)
Hemoglobin: 6.5 g/dL — CL (ref 13.0–17.0)
Hemoglobin: 8.8 g/dL — ABNORMAL LOW (ref 13.0–17.0)
O2 Saturation: 84 %
O2 Saturation: 91 %
Patient temperature: 97.9
Patient temperature: 99.3
Potassium: 3 mmol/L — ABNORMAL LOW (ref 3.5–5.1)
Potassium: 3.1 mmol/L — ABNORMAL LOW (ref 3.5–5.1)
Sodium: 140 mmol/L (ref 135–145)
Sodium: 141 mmol/L (ref 135–145)
TCO2: 33 mmol/L — ABNORMAL HIGH (ref 22–32)
TCO2: 34 mmol/L — ABNORMAL HIGH (ref 22–32)
pCO2 arterial: 47.4 mmHg (ref 32.0–48.0)
pCO2 arterial: 48.3 mmHg — ABNORMAL HIGH (ref 32.0–48.0)
pH, Arterial: 7.417 (ref 7.350–7.450)
pH, Arterial: 7.447 (ref 7.350–7.450)
pO2, Arterial: 48 mmHg — ABNORMAL LOW (ref 83.0–108.0)
pO2, Arterial: 60 mmHg — ABNORMAL LOW (ref 83.0–108.0)

## 2019-04-03 LAB — CBC
HCT: 23.2 % — ABNORMAL LOW (ref 39.0–52.0)
HCT: 25.6 % — ABNORMAL LOW (ref 39.0–52.0)
HCT: 25 % — ABNORMAL LOW (ref 39.0–52.0)
Hemoglobin: 7.3 g/dL — ABNORMAL LOW (ref 13.0–17.0)
Hemoglobin: 8.1 g/dL — ABNORMAL LOW (ref 13.0–17.0)
Hemoglobin: 8.1 g/dL — ABNORMAL LOW (ref 13.0–17.0)
MCH: 31.2 pg (ref 26.0–34.0)
MCH: 30.3 pg (ref 26.0–34.0)
MCH: 30.6 pg (ref 26.0–34.0)
MCHC: 31.5 g/dL (ref 30.0–36.0)
MCHC: 31.6 g/dL (ref 30.0–36.0)
MCHC: 32.4 g/dL (ref 30.0–36.0)
MCV: 99.1 fL (ref 80.0–100.0)
MCV: 95.9 fL (ref 80.0–100.0)
MCV: 94.3 fL (ref 80.0–100.0)
Platelets: 95 10*3/uL — ABNORMAL LOW (ref 150–400)
Platelets: 93 10*3/uL — ABNORMAL LOW (ref 150–400)
Platelets: 97 10*3/uL — ABNORMAL LOW (ref 150–400)
RBC: 2.34 MIL/uL — ABNORMAL LOW (ref 4.22–5.81)
RBC: 2.67 MIL/uL — ABNORMAL LOW (ref 4.22–5.81)
RBC: 2.65 MIL/uL — ABNORMAL LOW (ref 4.22–5.81)
RDW: 16.4 % — ABNORMAL HIGH (ref 11.5–15.5)
RDW: 17.6 % — ABNORMAL HIGH (ref 11.5–15.5)
RDW: 18 % — ABNORMAL HIGH (ref 11.5–15.5)
WBC: 6.9 10*3/uL (ref 4.0–10.5)
WBC: 6.1 10*3/uL (ref 4.0–10.5)
WBC: 4.6 10*3/uL (ref 4.0–10.5)
nRBC: 0 % (ref 0.0–0.2)
nRBC: 0 % (ref 0.0–0.2)
nRBC: 0 % (ref 0.0–0.2)

## 2019-04-03 LAB — GLUCOSE, CAPILLARY
Glucose-Capillary: 109 mg/dL — ABNORMAL HIGH (ref 70–99)
Glucose-Capillary: 197 mg/dL — ABNORMAL HIGH (ref 70–99)
Glucose-Capillary: 220 mg/dL — ABNORMAL HIGH (ref 70–99)
Glucose-Capillary: 246 mg/dL — ABNORMAL HIGH (ref 70–99)
Glucose-Capillary: 283 mg/dL — ABNORMAL HIGH (ref 70–99)
Glucose-Capillary: 288 mg/dL — ABNORMAL HIGH (ref 70–99)

## 2019-04-03 LAB — MAGNESIUM: Magnesium: 2.1 mg/dL (ref 1.7–2.4)

## 2019-04-03 LAB — CULTURE, RESPIRATORY W GRAM STAIN

## 2019-04-03 LAB — BASIC METABOLIC PANEL
Anion gap: 12 (ref 5–15)
BUN: 55 mg/dL — ABNORMAL HIGH (ref 8–23)
CO2: 31 mmol/L (ref 22–32)
Calcium: 8.7 mg/dL — ABNORMAL LOW (ref 8.9–10.3)
Chloride: 98 mmol/L (ref 98–111)
Creatinine, Ser: 2.03 mg/dL — ABNORMAL HIGH (ref 0.61–1.24)
GFR calc Af Amer: 37 mL/min — ABNORMAL LOW (ref >60)
GFR calc non Af Amer: 32 mL/min — ABNORMAL LOW (ref >60)
Glucose, Bld: 247 mg/dL — ABNORMAL HIGH (ref 70–99)
Potassium: 3.3 mmol/L — ABNORMAL LOW (ref 3.5–5.1)
Sodium: 141 mmol/L (ref 135–145)

## 2019-04-03 LAB — TRIGLYCERIDES: Triglycerides: 148 mg/dL (ref <150)

## 2019-04-03 LAB — PREPARE RBC (CROSSMATCH)

## 2019-04-03 LAB — OCCULT BLOOD X 1 CARD TO LAB, STOOL: Fecal Occult Bld: POSITIVE — AB

## 2019-04-03 LAB — PHOSPHORUS: Phosphorus: 3.4 mg/dL (ref 2.5–4.6)

## 2019-04-03 MED ORDER — HYDRALAZINE HCL 20 MG/ML IJ SOLN
5.0000 mg | INTRAMUSCULAR | Status: DC | PRN
  Administered 2019-04-05: 5 mg via INTRAVENOUS
  Administered 2019-04-05: 14:00:00 10 mg via INTRAVENOUS
  Filled 2019-04-03 (×2): qty 1

## 2019-04-03 MED ORDER — ATROPINE SULFATE 1 MG/10ML IJ SOSY
0.4000 mg | PREFILLED_SYRINGE | Freq: Every day | INTRAMUSCULAR | Status: DC | PRN
  Administered 2019-04-05 – 2019-04-09 (×2): 0.4 mg via INTRAVENOUS

## 2019-04-03 MED ORDER — SODIUM CHLORIDE 0.9% IV SOLUTION: Freq: Once | INTRAVENOUS | Status: AC

## 2019-04-03 MED ORDER — HYDRALAZINE HCL 20 MG/ML IJ SOLN
5.0000 mg | INTRAMUSCULAR | Status: DC | PRN
  Administered 2019-04-03: 10 mg via INTRAVENOUS
  Filled 2019-04-03: qty 1

## 2019-04-03 MED ORDER — ATROPINE SULFATE 1 MG/10ML IJ SOSY
PREFILLED_SYRINGE | INTRAMUSCULAR | Status: AC
  Filled 2019-04-03: qty 10

## 2019-04-03 MED ORDER — ALBUMIN HUMAN 25 % IV SOLN
25.0000 g | Freq: Four times a day (QID) | INTRAVENOUS | Status: AC
  Administered 2019-04-03 – 2019-04-04 (×4): 25 g via INTRAVENOUS
  Filled 2019-04-03 (×4): qty 100

## 2019-04-03 NOTE — Progress Notes (Signed)
eLink Physician-Brief Progress Note Patient Name: Marc Young DOB: 1945/07/03 MRN: [id number]   Date of Service  04/03/2019  HPI/Events of Note  Elevated blood pressure by arterial line  eICU Interventions  PRN Hydralazine order for SBP > 170 mmHg entered.        [name] 04/03/2019, 6:15 AM

## 2019-04-03 NOTE — Progress Notes (Signed)
Elink notified about patients HR going down to 36 and back up to 86 SR.  Will continue to monitor.

## 2019-04-03 NOTE — Progress Notes (Signed)
NAME:  Marc Young, MRN:  [id number], DOB:  05-Jul-1945, LOS: 14 ADMISSION DATE:  03/08/2019, CONSULTATION DATE: 1/1 REFERRING MD: Dr. Randol Kern, CHIEF COMPLAINT: Acute respiratory failure  Brief History   74 yo male developed viral respiratory symptoms from 12/23.  Found to have COVID 19 pneumonia.  Treated with steroids, remdesivir, tocilizumab.  Required intubation 1/01.  Past Medical History  CKD 1, DM, HLD, HTN  Significant Hospital Events   1/1 VDRF, start nimbex 1/3 Transfer to [facility] from [facility] for evaluation of GI bleed 1/4 underwent EGD with clip placed on gastric ulcer with visible vessel; transition off nimbex 1/9 start pressure support  Consults:  GI >> s/o 1/05  Procedures:  ETT 1/1 >>  Rt PICC 1/2 >>   Significant Diagnostic Tests:  1/4 EGD >> 2 gastric ulcers clipped.  No high risk stigmata.  Micro Data:  SARS CoV2 12/30 >> positive Blood 12/30 >> negative C. difficile 12/31 >> negative Sputum 1/10 >>  COVID Therapy:  Remdesivir 12/30 >> 1/03 Tocilizumab 12/30   Antimicrobials:  Azithromycin 12/31 >> 1/06 Ceftriaxone 12/31 >> 1/05 Cefepime 1/10>>> Vanc 1/10>>>1/13  Interim history/subjective:  HR drop to 30s during bath but improved HTN overnight treated with hydralazine Hg drop to 6.5 and transfused Worsening Cr even without diureses  Objective   Blood pressure (!) 141/67, pulse 84, temperature 98.6 F (37 C), temperature source Oral, resp. rate (!) 22, height 5\' 3"  (1.6 m), weight 65.9 kg, SpO2 96 %.    Vent Mode: PRVC FiO2 (%):  [60 %-90 %] 60 % Set Rate:  [32 bmp] 32 bmp Vt Set:  [340 mL] 340 mL PEEP:  [10 cmH20-12 cmH20] 10 cmH20 Plateau Pressure:  [24 cmH20-28 cmH20] 24 cmH20   Intake/Output Summary (Last 24 hours) at 04/03/2019 0746 Last data filed at 04/03/2019 0736 Gross per 24 hour  Intake 4123 ml  Output 2720 ml  Net 1403 ml   Filed Weights   03/31/19 0350 04/02/19 0431 04/03/19 0323  Weight: 70.1 kg 65.6 kg 65.9  kg    Examination:  General - Acutely ill appearing male, NAD, sedate HEENT: [place]/AT, PERRL, EOM-I and MMM, ETT in place Cardiac - RRR, Nl S1/S2 and -M/R/G Chest - Diminished diffusely Abdomen - Soft, NT, ND and +BS Extremities - -edema and -tenderness Skin - no rashes Neuro - Sedate but withdraws to pain  I reviewed CXR myself, ETT is in a good position, infiltrate L>R, socked in LLL  Discussed with beside RN and RT  Resolved Hospital Problem list:    Assessment & Plan:   Acute hypoxic respiratory failure from COVID 19 pneumonia with ARDS with possible bacterial HCAP. - Hold further diureses at this time - Albumin 25 g q6 x4 doses - Free water at 400 q8, will maintain for now - BMET in AM - Replace electrolytes as indicated - Will speak with wife regarding trach on Friday if no improvement by AM - Sputum cultures negative, will D/C vanc but continue cefepime with stop date at 8 days since MRSA swab is negative - Not ready for extubation - F/u CXR and ABG in AM - No further proning  ABLA from Gastric ulcer. Constipation. - Continue protonix for now - Hb drop 1/10 >> no signs of bleeding - F/u CBC - Transfuse if <7 - D/C lovenox given Hg drop - Stool OB if able to get card - Place SCDs - Adjust bowel regimen  Hx of HTN, HLD. - Hold Coreg and pravastatin for  now - PRN hydralazine  DM type 2. - SSI - Levemir 10 BID  Hypernatremia. - Continue free water for now - Hold diureses given renal function  Hypokalemia. - Replace as indicated - BMET in AM  Acute metabolic encephalopathy from hypoxia. - RASS goal 0 to -1 - Continue klonopin, MSIR, seroquel  Called emergently bedside, patient vagaled when being moved.  Stabilized once back on his back but now desaturating.  Renal function deteriorating.  Wife updated over the phone, informed of events.  Asked about code status, not ready to discuss.  Full for now.  If FiO2 and PEEP improve then will consider trach  on Friday and she is aware of that.  Best practice:  DVT prophylaxis: lovenox SUP: protonix Nutrition: tube feeds Mobility: bed rest Goals of care: full code Disposition: ICU  Labs:   CMP Latest Ref Rng & Units 04/03/2019 04/03/2019 04/02/2019  Glucose 70 - 99 mg/dL 247(H) - 276(H)  BUN 8 - 23 mg/dL 55(H) - 43(H)  Creatinine 0.61 - 1.24 mg/dL 2.03(H) - 1.43(H)  Sodium 135 - 145 mmol/L 141 141 144  Potassium 3.5 - 5.1 mmol/L 3.3(L) 3.0(L) 3.7  Chloride 98 - 111 mmol/L 98 - 102  CO2 22 - 32 mmol/L 31 - 32  Calcium 8.9 - 10.3 mg/dL 8.7(L) - 8.4(L)  Total Protein 6.5 - 8.1 g/dL - - -  Total Bilirubin 0.3 - 1.2 mg/dL - - -  Alkaline Phos 38 - 126 U/L - - -  AST 15 - 41 U/L - - -  ALT 0 - 44 U/L - - -    CBC Latest Ref Rng & Units 04/03/2019 04/03/2019 04/02/2019  WBC 4.0 - 10.5 K/uL 6.9 - 9.4  Hemoglobin 13.0 - 17.0 g/dL 7.3(L) 6.5(LL) 7.9(L)  Hematocrit 39.0 - 52.0 % 23.2(L) 19.0(L) 25.8(L)  Platelets 150 - 400 K/uL 95(L) - 95(L)    ABG    Component Value Date/Time   PHART 7.447 04/03/2019 0222   PCO2ART 47.4 04/03/2019 0222   PO2ART 60.0 (L) 04/03/2019 0222   HCO3 32.6 (H) 04/03/2019 0222   TCO2 34 (H) 04/03/2019 0222   ACIDBASEDEF 2.0 03/27/2019 0300   O2SAT 91.0 04/03/2019 0222    CBG (last 3)  Recent Labs    04/02/19 2322 04/03/19 0315 04/03/19 0732  GLUCAP 278* 220* 246*   The patient is critically ill with multiple organ systems failure and requires high complexity decision making for assessment and support, frequent evaluation and titration of therapies, application of advanced monitoring technologies and extensive interpretation of multiple databases.   Critical Care Time devoted to patient care services described in this note is  32  Minutes. This time reflects time of care of this signee Dr Jennet Maduro. This critical care time does not reflect procedure time, or teaching time or supervisory time of PA/NP/Med student/Med Resident etc but could involve care  discussion time.  Rush Farmer, M.D. [facility] Pulmonary/Critical Care Medicine.

## 2019-04-03 NOTE — Progress Notes (Addendum)
RT called due to pt's HR dropping into 20s briefly per RN. SATs maintaining 85-86%. PEEP increased to 14 and FIO2 increased to 80% per verbal CCM order. SATs now 91%. RT will continue to monitor.

## 2019-04-03 NOTE — Progress Notes (Signed)
eLink Physician-Brief Progress Note Patient Name: Marc Young DOB: July 18, 1945 MRN: [id number]   Date of Service  04/03/2019  HPI/Events of Note  Earlier transient drop in heart rate into the 30's while he was being bathed-likely vagal and not accompanied by a drop in blood pressure, has not recurred. Pt with elevated blood pressure up to SBP 170's currently  eICU Interventions  Hydralazine 5-10 mg iv Q 4 hours PRN SBP > 150 mmHg        [name] 04/03/2019, 3:20 AM

## 2019-04-03 NOTE — Progress Notes (Signed)
eLink Physician-Brief Progress Note Patient Name: Marc Young DOB: 05/09/1945 MRN: [id number]   Date of Service  04/03/2019  HPI/Events of Note  Hgb 7.3  eICU Interventions  I unit PRBC ordered transfused        Thomasene Lot Rinda Rollyson 04/03/2019, 5:03 AM

## 2019-04-03 NOTE — Progress Notes (Signed)
Assisted tele visit to patient with wife.  Marc Tocco M [name], RN   

## 2019-04-03 NOTE — Progress Notes (Signed)
Heme + stool resulted to Dr Molli Knock and also Hgb 8.1/ Hct 25.6.  Orders to stop tube feeds and repeat cbc at 2000

## 2019-04-03 NOTE — Progress Notes (Signed)
eLink Physician-Brief Progress Note Patient Name: Marc Young DOB: 26-Jul-1945 MRN: [id number]   Date of Service  04/03/2019  HPI/Events of Note  Notified that patient had suctioning done, then desaturated and Fio2 was increased to 100%. O2 sat is 79. I am told that while suctioning, HR also dropped to 20. HR is now in 50s. O2 sat is now 85 after increasing peep. I am told bilateral breath sounds are present. He is not in any distress on camera.   eICU Interventions  CXR, ABG ordered RN requested an order for PRN atropine to have on standby, has had prior such events as well. Was proned in the past, Will evaluate once ABG/X ray done.      Intervention Category Major Interventions: Respiratory failure - evaluation and management  Oretha Milch 04/03/2019, 11:51 PM

## 2019-04-04 ENCOUNTER — Inpatient Hospital Stay (HOSPITAL_COMMUNITY): Payer: Medicare Other

## 2019-04-04 DIAGNOSIS — R6521 Severe sepsis with septic shock: Secondary | ICD-10-CM

## 2019-04-04 DIAGNOSIS — A419 Sepsis, unspecified organism: Secondary | ICD-10-CM

## 2019-04-04 DIAGNOSIS — J9811 Atelectasis: Secondary | ICD-10-CM

## 2019-04-04 DIAGNOSIS — L8990 Pressure ulcer of unspecified site, unspecified stage: Secondary | ICD-10-CM | POA: Insufficient documentation

## 2019-04-04 LAB — CBC
HCT: 25.8 % — ABNORMAL LOW (ref 39.0–52.0)
HCT: 25.9 % — ABNORMAL LOW (ref 39.0–52.0)
Hemoglobin: 8.2 g/dL — ABNORMAL LOW (ref 13.0–17.0)
Hemoglobin: 8.3 g/dL — ABNORMAL LOW (ref 13.0–17.0)
MCH: 30.7 pg (ref 26.0–34.0)
MCH: 31.2 pg (ref 26.0–34.0)
MCHC: 31.8 g/dL (ref 30.0–36.0)
MCHC: 32 g/dL (ref 30.0–36.0)
MCV: 96.6 fL (ref 80.0–100.0)
MCV: 97.4 fL (ref 80.0–100.0)
Platelets: 104 10*3/uL — ABNORMAL LOW (ref 150–400)
Platelets: 102 10*3/uL — ABNORMAL LOW (ref 150–400)
RBC: 2.67 MIL/uL — ABNORMAL LOW (ref 4.22–5.81)
RBC: 2.66 MIL/uL — ABNORMAL LOW (ref 4.22–5.81)
RDW: 18 % — ABNORMAL HIGH (ref 11.5–15.5)
RDW: 17.8 % — ABNORMAL HIGH (ref 11.5–15.5)
WBC: 4.4 10*3/uL (ref 4.0–10.5)
WBC: 4.2 10*3/uL (ref 4.0–10.5)
nRBC: 0 % (ref 0.0–0.2)
nRBC: 0 % (ref 0.0–0.2)

## 2019-04-04 LAB — POCT I-STAT 7, (LYTES, BLD GAS, ICA,H+H)
Acid-Base Excess: 8 mmol/L — ABNORMAL HIGH (ref 0.0–2.0)
Acid-Base Excess: 6 mmol/L — ABNORMAL HIGH (ref 0.0–2.0)
Acid-Base Excess: 4 mmol/L — ABNORMAL HIGH (ref 0.0–2.0)
Bicarbonate: 33.3 mmol/L — ABNORMAL HIGH (ref 20.0–28.0)
Bicarbonate: 31.8 mmol/L — ABNORMAL HIGH (ref 20.0–28.0)
Bicarbonate: 30.4 mmol/L — ABNORMAL HIGH (ref 20.0–28.0)
Calcium, Ion: 1.22 mmol/L (ref 1.15–1.40)
Calcium, Ion: 1.21 mmol/L (ref 1.15–1.40)
Calcium, Ion: 1.27 mmol/L (ref 1.15–1.40)
HCT: 29 % — ABNORMAL LOW (ref 39.0–52.0)
HCT: 23 % — ABNORMAL LOW (ref 39.0–52.0)
HCT: 26 % — ABNORMAL LOW (ref 39.0–52.0)
Hemoglobin: 9.9 g/dL — ABNORMAL LOW (ref 13.0–17.0)
Hemoglobin: 7.8 g/dL — ABNORMAL LOW (ref 13.0–17.0)
Hemoglobin: 8.8 g/dL — ABNORMAL LOW (ref 13.0–17.0)
O2 Saturation: 92 %
O2 Saturation: 90 %
O2 Saturation: 96 %
Patient temperature: 98.7
Patient temperature: 98.7
Patient temperature: 98.4
Potassium: 2.9 mmol/L — ABNORMAL LOW (ref 3.5–5.1)
Potassium: 2.9 mmol/L — ABNORMAL LOW (ref 3.5–5.1)
Potassium: 3.5 mmol/L (ref 3.5–5.1)
Sodium: 140 mmol/L (ref 135–145)
Sodium: 141 mmol/L (ref 135–145)
Sodium: 139 mmol/L (ref 135–145)
TCO2: 35 mmol/L — ABNORMAL HIGH (ref 22–32)
TCO2: 33 mmol/L — ABNORMAL HIGH (ref 22–32)
TCO2: 32 mmol/L (ref 22–32)
pCO2 arterial: 51.2 mmHg — ABNORMAL HIGH (ref 32.0–48.0)
pCO2 arterial: 49.6 mmHg — ABNORMAL HIGH (ref 32.0–48.0)
pCO2 arterial: 53.1 mmHg — ABNORMAL HIGH (ref 32.0–48.0)
pH, Arterial: 7.421 (ref 7.350–7.450)
pH, Arterial: 7.415 (ref 7.350–7.450)
pH, Arterial: 7.365 (ref 7.350–7.450)
pO2, Arterial: 65 mmHg — ABNORMAL LOW (ref 83.0–108.0)
pO2, Arterial: 60 mmHg — ABNORMAL LOW (ref 83.0–108.0)
pO2, Arterial: 87 mmHg (ref 83.0–108.0)

## 2019-04-04 LAB — BASIC METABOLIC PANEL
Anion gap: 12 (ref 5–15)
Anion gap: 9 (ref 5–15)
BUN: 73 mg/dL — ABNORMAL HIGH (ref 8–23)
BUN: 69 mg/dL — ABNORMAL HIGH (ref 8–23)
CO2: 31 mmol/L (ref 22–32)
CO2: 29 mmol/L (ref 22–32)
Calcium: 8.9 mg/dL (ref 8.9–10.3)
Calcium: 8.7 mg/dL — ABNORMAL LOW (ref 8.9–10.3)
Chloride: 98 mmol/L (ref 98–111)
Chloride: 102 mmol/L (ref 98–111)
Creatinine, Ser: 2.19 mg/dL — ABNORMAL HIGH (ref 0.61–1.24)
Creatinine, Ser: 2.25 mg/dL — ABNORMAL HIGH (ref 0.61–1.24)
GFR calc Af Amer: 33 mL/min — ABNORMAL LOW (ref >60)
GFR calc Af Amer: 32 mL/min — ABNORMAL LOW (ref >60)
GFR calc non Af Amer: 29 mL/min — ABNORMAL LOW (ref >60)
GFR calc non Af Amer: 28 mL/min — ABNORMAL LOW (ref >60)
Glucose, Bld: 64 mg/dL — ABNORMAL LOW (ref 70–99)
Glucose, Bld: 105 mg/dL — ABNORMAL HIGH (ref 70–99)
Potassium: 2.9 mmol/L — ABNORMAL LOW (ref 3.5–5.1)
Potassium: 3.5 mmol/L (ref 3.5–5.1)
Sodium: 141 mmol/L (ref 135–145)
Sodium: 140 mmol/L (ref 135–145)

## 2019-04-04 LAB — TYPE AND SCREEN
ABO/RH(D): O POS
Antibody Screen: NEGATIVE
Unit division: 0

## 2019-04-04 LAB — BPAM RBC
Blood Product Expiration Date: 202101182359
ISSUE DATE / TIME: 202101130702
Unit Type and Rh: 9500

## 2019-04-04 LAB — PROTIME-INR
INR: 1.3 — ABNORMAL HIGH (ref 0.8–1.2)
Prothrombin Time: 16 seconds — ABNORMAL HIGH (ref 11.4–15.2)

## 2019-04-04 LAB — GLUCOSE, CAPILLARY
Glucose-Capillary: 100 mg/dL — ABNORMAL HIGH (ref 70–99)
Glucose-Capillary: 109 mg/dL — ABNORMAL HIGH (ref 70–99)
Glucose-Capillary: 167 mg/dL — ABNORMAL HIGH (ref 70–99)
Glucose-Capillary: 179 mg/dL — ABNORMAL HIGH (ref 70–99)
Glucose-Capillary: 56 mg/dL — ABNORMAL LOW (ref 70–99)
Glucose-Capillary: 80 mg/dL (ref 70–99)
Glucose-Capillary: 88 mg/dL (ref 70–99)

## 2019-04-04 LAB — MAGNESIUM: Magnesium: 2.4 mg/dL (ref 1.7–2.4)

## 2019-04-04 LAB — PHOSPHORUS: Phosphorus: 3.1 mg/dL (ref 2.5–4.6)

## 2019-04-04 LAB — TRIGLYCERIDES: Triglycerides: 88 mg/dL (ref <150)

## 2019-04-04 LAB — APTT: aPTT: 33 seconds (ref 24–36)

## 2019-04-04 MED ORDER — DEXTROSE 50 % IV SOLN: 12.5000 g | Freq: Once | INTRAVENOUS | Status: AC

## 2019-04-04 MED ORDER — FREE WATER
250.0000 mL | Freq: Four times a day (QID) | Status: DC
  Administered 2019-04-08 – 2019-04-09 (×4): 250 mL

## 2019-04-04 MED ORDER — DEXTROSE 50 % IV SOLN
INTRAVENOUS | Status: AC
  Administered 2019-04-04: 12.5 g via INTRAVENOUS
  Filled 2019-04-04: qty 50

## 2019-04-04 MED ORDER — POTASSIUM CHLORIDE 10 MEQ/50ML IV SOLN
10.0000 meq | INTRAVENOUS | Status: AC
  Administered 2019-04-04 (×4): 10 meq via INTRAVENOUS
  Filled 2019-04-04 (×4): qty 50

## 2019-04-04 MED ORDER — DEXTROSE 10 % IV SOLN: INTRAVENOUS | Status: DC

## 2019-04-04 NOTE — Progress Notes (Signed)
Pt is past 21 days of being in covid ICU. Will speak with attending physician to determine if patient is able to be transferred to non-covid ICU.

## 2019-04-04 NOTE — Progress Notes (Signed)
This RN unable to flush cortrak feeding tube. Dietitian informed and instructed this RN to inform attending MD to evaluate need for OG tube until cortrak team is available tomorrow.

## 2019-04-04 NOTE — Procedures (Signed)
Bronchoscopy Procedure Note Alver Leete [id number] March 13, 1946  Procedure: Bronchoscopy Indications: Diagnostic evaluation of the airways, Obtain specimens for culture and/or other diagnostic studies and Remove secretions  Procedure Details Consent: Risks of procedure as well as the alternatives and risks of each were explained to the (patient/caregiver).  Consent for procedure obtained. Time Out: Verified patient identification, verified procedure, site/side was marked, verified correct patient position, special equipment/implants available, medications/allergies/relevent history reviewed, required imaging and test results available.  Performed  In preparation for procedure, patient was given 100% FiO2 and bronchoscope lubricated. Sedation: Benzodiazepines and Fentanyl  Airway entered and the following bronchi were examined: RUL, RML, RLL, LUL, LLL and Bronchi.   Left main blocked by purulent secretions, removed and sample sent Bronchoscope removed.  , Patient placed back on 100% FiO2 at conclusion of procedure.    Evaluation Hemodynamic Status: Transient hypotension resolved spontaneously; O2 sats: Asystole resolved with atropine Patient's Current Condition: stable Specimens:  Sent purulent fluid Complications: No apparent complications Patient did tolerate procedure well.   Koren Bound 04/04/2019

## 2019-04-04 NOTE — Progress Notes (Signed)
Notified provider patient desaturated to 70% and became bradycardic to HR 19 during suctioning. Provider ordered FiO2 be increased to 100% and peep be increased from 12 to 14.

## 2019-04-04 NOTE — Progress Notes (Addendum)
eLink Physician-Brief Progress Note Patient Name: Marc Young DOB: September 06, 1945 MRN: [id number]   Date of Service  04/04/2019  HPI/Events of Note  After increasing Fio2 and peep, O2 sats remain 93-94, peak pressures are 28-30 Patient also had a GI bleed earlier and so empiric AC would not be a good choice CXR is worse anyway, so I think this explains his desaturation for now  eICU Interventions  Asked RN to repeat ABG and send AM labs now Depending on K, will consider diuretic and come down on Fio2 Have asked RN to call us back with results      Intervention Category Major Interventions: Respiratory failure - evaluation and management  Daleen Steinhaus G Shareena Nusz 04/04/2019, 2:58 AM   5.30 am K is 2.9, creatinine is 2.1  Will replete with 40 meq IV Kcl Hemoglobin 7.8 Repeats ordered for 11.30 am

## 2019-04-04 NOTE — Progress Notes (Addendum)
NAME:  Marc Young, MRN:  [id number], DOB:  [birth year], LOS: 15 ADMISSION DATE:  03-22-2019, CONSULTATION DATE: 1/1 REFERRING MD: Dr. Randol Kern, CHIEF COMPLAINT: Acute respiratory failure  Brief History   74 yo male developed viral respiratory symptoms from 12/23.  Found to have COVID 19 pneumonia.  Treated with steroids, remdesivir, tocilizumab.  Required intubation 1/01.  Past Medical History  CKD 1, DM, HLD, HTN  Significant Hospital Events   1/1 VDRF, start nimbex 1/3 Transfer to [facility] from [facility] for evaluation of GI bleed 1/4 underwent EGD with clip placed on gastric ulcer with visible vessel; transition off nimbex 1/9 start pressure support  Consults:  GI >> s/o 1/05  Procedures:  ETT 1/1 >>  Rt PICC 1/2 >>   Significant Diagnostic Tests:  1/4 EGD >> 2 gastric ulcers clipped.  No high risk stigmata.  Micro Data:  SARS CoV2 12/30 >> positive Blood 12/30 >> negative C. difficile 12/31 >> negative Sputum 1/10 >>  COVID Therapy:  Remdesivir 12/30 >> 1/03 Tocilizumab 12/30   Antimicrobials:  Azithromycin 12/31 >> 1/06 Ceftriaxone 12/31 >> 1/05 Cefepime 1/10>>> Vanc 1/10>>>1/13  Interim history/subjective:  Significant desaturation overnight With repositioning or suction patient drops HR to 20-30 Pressors increased FiO2 and PEEP increased overnight  Objective   Blood pressure (!) 149/71, pulse 71, temperature 98.7 F (37.1 C), temperature source Axillary, resp. rate (!) 21, height 5\' 3"  (1.6 m), weight 72.6 kg, SpO2 (!) 88 %.    Vent Mode: PRVC FiO2 (%):  [60 %-100 %] 100 % Set Rate:  [32 bmp] 32 bmp Vt Set:  [340 mL] 340 mL PEEP:  [10 cmH20-14 cmH20] 14 cmH20 Plateau Pressure:  [27 cmH20-28 cmH20] 28 cmH20   Intake/Output Summary (Last 24 hours) at 04/04/2019 0742 Last data filed at 04/04/2019 0700 Gross per 24 hour  Intake 2990.02 ml  Output 850 ml  Net 2140.02 ml   Filed Weights   04/02/19 0431 04/03/19 0323 04/04/19 0341  Weight: 65.6  kg 65.9 kg 72.6 kg   Examination:  General - Acutely ill appearing male, sedate, not responsive HEENT: [place]/AT, PERRL, EOM-spontaneous, MMM and ETT is in place Cardiac - RRR, Nl S1/S2 and -M/R/G Chest - Coarse diffusely, decreased at the bases L>R Abdomen - Soft, NT, ND and +BS Extremities - 1+ edema and -tenderness Skin - no rashes Neuro - Sedate, poorly responsive  I reviewed CXR myself, ETT is in a good position, infiltrate L>R, left main stem bronchus cut off sign  Discussed with bedside RN and RT  Resolved Hospital Problem list:    Assessment & Plan:   Acute hypoxic respiratory failure from COVID 19 pneumonia with ARDS with possible bacterial HCAP. - Hold further diureses given renal function and hemodynamics - D/C further albumin - Free water to be decreased to 250 q6 - BMET in AM - Replace electrolytes as indicated - Will speak with wife regarding trach on Friday if no improvement by AM - Sputum cultures negative, will D/C vanc but continue cefepime with stop date at 8 days since MRSA swab is negative - Not ready for extubation - F/u CXR and ABG in AM - No further proning  ABLA from Gastric ulcer. Constipation. - Continue protonix for now - Hg down to 7.8, all anti-coagulation held but drop is not significant for risk of EGD (severe vagal response that can easily result in asystolic arrest) - AM CBC - Transfuse if <7 - SCD and d/c all anti-coagulation - Stool OB positive, unfortunately  little to be done about that - Check coags  Septic shock: - Levophed for BP support - KVO IVF at this point severe fluid overload - Continue abx - Cefepime  Hx of HTN, HLD. - Hold Coreg and pravastatin for now - D/C hydralazine  DM type 2. - SSI - D/C levemir since NPO  Hypernatremia. - Continue free water for now - Hold diureses given renal function  Hypokalemia. - Replace as indicated - BMET in AM  Acute metabolic encephalopathy from hypoxia. - RASS goal 0 to  -1 - Continue klonopin, MSIR, seroquel  Spoke with wife over the phone at length after two conversations decision was made to proceed with bronchoscopy and if patient is to arrest during bronchoscopy will not resuscitate.  If no improvement over the next few days after opening the left main then will need to discuss [place] further.  In the meantime, LCB with no CPR/cardioversion.  Best practice:  DVT prophylaxis: lovenox SUP: protonix Nutrition: tube feeds Mobility: bed rest Goals of care: full code Disposition: ICU  Labs:   CMP Latest Ref Rng & Units 04/04/2019 04/04/2019 04/04/2019  Glucose 70 - 99 mg/dL - 64(L) -  BUN 8 - 23 mg/dL - 73(H) -  Creatinine 0.61 - 1.24 mg/dL - 2.19(H) -  Sodium 135 - 145 mmol/L 141 141 140  Potassium 3.5 - 5.1 mmol/L 2.9(L) 2.9(L) 2.9(L)  Chloride 98 - 111 mmol/L - 98 -  CO2 22 - 32 mmol/L - 31 -  Calcium 8.9 - 10.3 mg/dL - 8.9 -  Total Protein 6.5 - 8.1 g/dL - - -  Total Bilirubin 0.3 - 1.2 mg/dL - - -  Alkaline Phos 38 - 126 U/L - - -  AST 15 - 41 U/L - - -  ALT 0 - 44 U/L - - -    CBC Latest Ref Rng & Units 04/04/2019 04/04/2019 04/04/2019  WBC 4.0 - 10.5 K/uL - 4.4 -  Hemoglobin 13.0 - 17.0 g/dL 7.8(L) 8.2(L) 9.9(L)  Hematocrit 39.0 - 52.0 % 23.0(L) 25.8(L) 29.0(L)  Platelets 150 - 400 K/uL - 104(L) -    ABG    Component Value Date/Time   PHART 7.415 04/04/2019 0449   PCO2ART 49.6 (H) 04/04/2019 0449   PO2ART 60.0 (L) 04/04/2019 0449   HCO3 31.8 (H) 04/04/2019 0449   TCO2 33 (H) 04/04/2019 0449   ACIDBASEDEF 2.0 03/27/2019 0300   O2SAT 90.0 04/04/2019 0449    CBG (last 3)  Recent Labs    04/03/19 2335 04/04/19 0310 04/04/19 0351  GLUCAP 109* 56* 100*   The patient is critically ill with multiple organ systems failure and requires high complexity decision making for assessment and support, frequent evaluation and titration of therapies, application of advanced monitoring technologies and extensive interpretation of multiple  databases.   Critical Care Time devoted to patient care services described in this note is  45  Minutes. This time reflects time of care of this signee Dr Jennet Maduro. This critical care time does not reflect procedure time, or teaching time or supervisory time of PA/NP/Med student/Med Resident etc but could involve care discussion time.  Rush Farmer, M.D. [facility] Pulmonary/Critical Care Medicine.

## 2019-04-04 NOTE — Progress Notes (Signed)
Still unable to flush patient's cortrak. Dr. Molli Knock aware and states to hold off on inserting an OG tube until tomorrow when the Cortrak team can come assess the situation.

## 2019-04-04 NOTE — Sepsis Progress Note (Signed)
Assisted tele visit to patient with wife.  Elma Shands M Rykker Coviello, RN   

## 2019-04-04 NOTE — Progress Notes (Signed)
Pt's wife requested to come visit patient. She stated she was contacted by Dr. Molli Knock to make a decision regarding plan of care but states she can not make a decision without seeing him in person. This RN was granted permission by Selena Batten, unit AD for one 15 minute visit to help make a decision. This RN explained to the patient's wife the current visitation policy as it related to the Covid ICU which is only 2 visitors for 15 minutes for comfort care/end of life but the AD would be willing to allow her to come visit him if she feels like it would help guide her to a decision. She asked what exactly comfort care meant so this RN explained to her the details of comfort care and patient stated 'well I dont want that. I dont want to yall stop anything you are doing. I have hope God will heal him and I want him to come home." This RN assured the wife we would respect her wishes and will everything we can for him. The patient's wife asked about the bronchcoscopy procedure Dr. Molli Knock had mentioned to her and asked about his code status. She wanted to know what exactly would be done during CPR. This RN explained to her what CPR is per ACLS guidelines and she stated she was unable to make a decision about his code status at this time but would let Dr. Molli Knock know when she makes a decision. I assured her again that we would do everything for him we could do to align with her wishes and to call back anytime with any questions or concerns. I offered her the opportunity again to come see the patient in person here at the hospital if she needed to in order for her to make a decision but she said would think about it. Shortly after this conversation, the patient's wife called this RN back to let me know she did not feel comfortable coming in person to the covid ICU but did request a video conference. Video conference was done. She called this RN back after the video conference to have Dr. Molli Knock call her as soon as possible because  she had made a decision about code status but did not disclose to me what her decision was at that time. Dr. Molli Knock paged.

## 2019-04-05 ENCOUNTER — Inpatient Hospital Stay (HOSPITAL_COMMUNITY): Payer: Medicare Other

## 2019-04-05 LAB — GLUCOSE, CAPILLARY
Glucose-Capillary: 184 mg/dL — ABNORMAL HIGH (ref 70–99)
Glucose-Capillary: 221 mg/dL — ABNORMAL HIGH (ref 70–99)
Glucose-Capillary: 238 mg/dL — ABNORMAL HIGH (ref 70–99)
Glucose-Capillary: 263 mg/dL — ABNORMAL HIGH (ref 70–99)
Glucose-Capillary: 269 mg/dL — ABNORMAL HIGH (ref 70–99)
Glucose-Capillary: 283 mg/dL — ABNORMAL HIGH (ref 70–99)

## 2019-04-05 LAB — POCT I-STAT 7, (LYTES, BLD GAS, ICA,H+H)
Acid-Base Excess: 3 mmol/L — ABNORMAL HIGH (ref 0.0–2.0)
Bicarbonate: 28 mmol/L (ref 20.0–28.0)
Calcium, Ion: 1.24 mmol/L (ref 1.15–1.40)
HCT: 25 % — ABNORMAL LOW (ref 39.0–52.0)
Hemoglobin: 8.5 g/dL — ABNORMAL LOW (ref 13.0–17.0)
O2 Saturation: 92 %
Patient temperature: 97.9
Potassium: 3.1 mmol/L — ABNORMAL LOW (ref 3.5–5.1)
Sodium: 138 mmol/L (ref 135–145)
TCO2: 29 mmol/L (ref 22–32)
pCO2 arterial: 44.3 mmHg (ref 32.0–48.0)
pH, Arterial: 7.408 (ref 7.350–7.450)
pO2, Arterial: 63 mmHg — ABNORMAL LOW (ref 83.0–108.0)

## 2019-04-05 LAB — BASIC METABOLIC PANEL
Anion gap: 12 (ref 5–15)
BUN: 65 mg/dL — ABNORMAL HIGH (ref 8–23)
CO2: 27 mmol/L (ref 22–32)
Calcium: 8.9 mg/dL (ref 8.9–10.3)
Chloride: 99 mmol/L (ref 98–111)
Creatinine, Ser: 2.07 mg/dL — ABNORMAL HIGH (ref 0.61–1.24)
GFR calc Af Amer: 36 mL/min — ABNORMAL LOW (ref >60)
GFR calc non Af Amer: 31 mL/min — ABNORMAL LOW (ref >60)
Glucose, Bld: 233 mg/dL — ABNORMAL HIGH (ref 70–99)
Potassium: 3.3 mmol/L — ABNORMAL LOW (ref 3.5–5.1)
Sodium: 138 mmol/L (ref 135–145)

## 2019-04-05 LAB — MAGNESIUM: Magnesium: 2.2 mg/dL (ref 1.7–2.4)

## 2019-04-05 LAB — PHOSPHORUS: Phosphorus: 3.1 mg/dL (ref 2.5–4.6)

## 2019-04-05 LAB — CBC
HCT: 28.6 % — ABNORMAL LOW (ref 39.0–52.0)
Hemoglobin: 9 g/dL — ABNORMAL LOW (ref 13.0–17.0)
MCH: 30.7 pg (ref 26.0–34.0)
MCHC: 31.5 g/dL (ref 30.0–36.0)
MCV: 97.6 fL (ref 80.0–100.0)
Platelets: 115 10*3/uL — ABNORMAL LOW (ref 150–400)
RBC: 2.93 MIL/uL — ABNORMAL LOW (ref 4.22–5.81)
RDW: 17 % — ABNORMAL HIGH (ref 11.5–15.5)
WBC: 3.4 10*3/uL — ABNORMAL LOW (ref 4.0–10.5)
nRBC: 0 % (ref 0.0–0.2)

## 2019-04-05 MED ORDER — FUROSEMIDE 10 MG/ML IJ SOLN
40.0000 mg | Freq: Four times a day (QID) | INTRAMUSCULAR | Status: AC
  Administered 2019-04-05 (×3): 40 mg via INTRAVENOUS
  Filled 2019-04-05 (×3): qty 4

## 2019-04-05 MED ORDER — PANCRELIPASE (LIP-PROT-AMYL) 10440-39150 UNITS PO TABS
20880.0000 [IU] | ORAL_TABLET | Freq: Once | ORAL | Status: AC
  Administered 2019-04-05: 15:00:00 20880 [IU]
  Filled 2019-04-05 (×4): qty 2

## 2019-04-05 MED ORDER — JUVEN PO PACK
1.0000 | PACK | Freq: Two times a day (BID) | ORAL | Status: DC
  Administered 2019-04-08 – 2019-04-10 (×5): 1
  Filled 2019-04-05 (×5): qty 1

## 2019-04-05 MED ORDER — HEPARIN SODIUM (PORCINE) 5000 UNIT/ML IJ SOLN
5000.0000 [IU] | Freq: Three times a day (TID) | INTRAMUSCULAR | Status: DC
  Administered 2019-04-05 – 2019-04-10 (×16): 5000 [IU] via SUBCUTANEOUS
  Filled 2019-04-05 (×16): qty 1

## 2019-04-05 MED ORDER — SODIUM BICARBONATE 650 MG PO TABS
650.0000 mg | ORAL_TABLET | Freq: Once | ORAL | Status: DC
  Filled 2019-04-05 (×3): qty 1

## 2019-04-05 MED ORDER — METOLAZONE 5 MG PO TABS
10.0000 mg | ORAL_TABLET | Freq: Once | ORAL | Status: AC
  Administered 2019-04-05: 10 mg via ORAL
  Filled 2019-04-05: qty 2

## 2019-04-05 MED ORDER — INSULIN ASPART 100 UNIT/ML ~~LOC~~ SOLN
0.0000 [IU] | SUBCUTANEOUS | Status: DC
  Administered 2019-04-05: 7 [IU] via SUBCUTANEOUS
  Administered 2019-04-05 (×3): 11 [IU] via SUBCUTANEOUS
  Administered 2019-04-06 (×2): 7 [IU] via SUBCUTANEOUS
  Administered 2019-04-06: 15:00:00 4 [IU] via SUBCUTANEOUS
  Administered 2019-04-06: 3 [IU] via SUBCUTANEOUS
  Administered 2019-04-06: 12:00:00 4 [IU] via SUBCUTANEOUS
  Administered 2019-04-07: 3 [IU] via SUBCUTANEOUS
  Administered 2019-04-07: 4 [IU] via SUBCUTANEOUS
  Administered 2019-04-07: 3 [IU] via SUBCUTANEOUS
  Administered 2019-04-07: 20:00:00 4 [IU] via SUBCUTANEOUS
  Administered 2019-04-07 – 2019-04-08 (×3): 3 [IU] via SUBCUTANEOUS
  Administered 2019-04-08 (×2): 4 [IU] via SUBCUTANEOUS
  Administered 2019-04-08: 23:00:00 3 [IU] via SUBCUTANEOUS
  Administered 2019-04-08: 4 [IU] via SUBCUTANEOUS
  Administered 2019-04-09 (×2): 7 [IU] via SUBCUTANEOUS
  Administered 2019-04-09 (×2): 4 [IU] via SUBCUTANEOUS
  Administered 2019-04-09: 3 [IU] via SUBCUTANEOUS
  Administered 2019-04-09: 08:00:00 4 [IU] via SUBCUTANEOUS
  Administered 2019-04-10 (×2): 7 [IU] via SUBCUTANEOUS
  Administered 2019-04-10: 20 [IU] via SUBCUTANEOUS

## 2019-04-05 MED ORDER — POTASSIUM CHLORIDE 10 MEQ/50ML IV SOLN: 10.0000 meq | INTRAVENOUS | Status: DC

## 2019-04-05 MED ORDER — VITAL AF 1.2 CAL PO LIQD: 1000.0000 mL | ORAL | Status: DC

## 2019-04-05 MED ORDER — HYDRALAZINE HCL 20 MG/ML IJ SOLN
20.0000 mg | Freq: Four times a day (QID) | INTRAMUSCULAR | Status: DC | PRN
  Administered 2019-04-05: 20 mg via INTRAVENOUS
  Administered 2019-04-06: 30 mg via INTRAVENOUS
  Administered 2019-04-07: 20 mg via INTRAVENOUS
  Filled 2019-04-05 (×3): qty 2

## 2019-04-05 MED ORDER — CLONIDINE HCL 0.2 MG/24HR TD PTWK
0.2000 mg | MEDICATED_PATCH | TRANSDERMAL | Status: DC
  Administered 2019-04-05: 0.2 mg via TRANSDERMAL
  Filled 2019-04-05: qty 1

## 2019-04-05 MED ORDER — POTASSIUM CHLORIDE 10 MEQ/50ML IV SOLN
10.0000 meq | INTRAVENOUS | Status: AC
  Administered 2019-04-05 (×2): 10 meq via INTRAVENOUS
  Filled 2019-04-05 (×2): qty 50

## 2019-04-05 MED ORDER — PRO-STAT SUGAR FREE PO LIQD
30.0000 mL | Freq: Every day | ORAL | Status: DC
  Administered 2019-04-08: 30 mL
  Filled 2019-04-05: qty 30

## 2019-04-05 MED ORDER — ALBUMIN HUMAN 25 % IV SOLN
25.0000 g | Freq: Four times a day (QID) | INTRAVENOUS | Status: AC
  Administered 2019-04-05 – 2019-04-06 (×3): 25 g via INTRAVENOUS
  Filled 2019-04-05 (×2): qty 50
  Filled 2019-04-05: qty 100

## 2019-04-05 MED ORDER — POTASSIUM CHLORIDE 10 MEQ/50ML IV SOLN
10.0000 meq | INTRAVENOUS | Status: DC
  Administered 2019-04-05 (×2): 10 meq via INTRAVENOUS
  Filled 2019-04-05 (×3): qty 50

## 2019-04-05 MED FILL — Medication: Qty: 1 | Status: AC

## 2019-04-05 NOTE — Progress Notes (Signed)
Cortrak Tube Team Note:  Consult received to place a Cortrak feeding tube; Cortrak tube clogged since yesterday.   Spoke with RN today who indicates she successfully unclogged Cortrak tube and now tube flushes well without difficulty.   If the tube becomes dislodged please keep the tube and contact the Cortrak team at [email or web address] (password [email or web address]) for replacement.  If after hours and replacement cannot be delayed, place a NG tube and confirm placement with an abdominal x-ray.   [employer] MS, RDN, LDN, CNSC [phone] Pager  [phone] Weekend/On-Call Pager

## 2019-04-05 NOTE — Progress Notes (Signed)
Patient heart rate 38. Atropine as per order given.see MAR

## 2019-04-05 NOTE — Progress Notes (Signed)
Nutrition Follow-up  DOCUMENTATION CODES:   Not applicable  INTERVENTION:   Tube Feeding:  Vital AF 1.2 @ 65 ml/hr Pro-Stat 30 mL daily Provides 132 g of protein, 1972 kcals and 1263 mL of free water  Total free water with 250 mL q 6 hour flush: 2263 mL of free water  Add 1 packet Juven BID via Cortrak tube, each packet provides 95 calories, 2.5 grams of protein (collagen), and 9.8 grams of carbohydrate (3 grams sugar); also contains 7 grams of L-arginine and L-glutamine, 300 mg vitamin C, 15 mg vitamin E, 1.2 mcg vitamin B-12, 9.5 mg zinc, 200 mg calcium, and 1.5 g  Calcium Beta-hydroxy-Beta-methylbutyrate to support wound healing   NUTRITION DIAGNOSIS:   Increased nutrient needs related to acute illness(COVID-19 infection) as evidenced by estimated needs.  Being addressed via TF   GOAL:   Patient will meet greater than or equal to 90% of their needs  Progressing   MONITOR:   Vent status, Labs, Weight trends, TF tolerance, I & O's  REASON FOR ASSESSMENT:   Consult Enteral/tube feeding initiation and management  ASSESSMENT:   74 year old man with history of diabetes and hypertension, viral symptoms beginning about 12/23.  Initial outpatient Covid testing negative but then COVID-19 positive on repeat.  Scheduled for monoclonal antibody infusion 12/30 but found to be significantly hypoxemic on room air.  Admitted 12/30 for further care.  Had been developing worsening cough, body aches, subjective fever.  Chest x-ray 12/30 with extensive multifocal bilateral infiltrates.  Empiric coverage for CAP initiated.  Corticosteroids, remdesivir started, Actemra given x1.  Experienced progressive respiratory distress following admission, ultimately required intubation mechanical ventilation early 1/1.  Per MD pt now LCB after discussion with family. PT having bradycardia with simple movement due to severe vagal response that can result in asystolic arrest.    1/01 intubated,  paralyzed 1/03 Transfer to [facility] from [facility] due to GI bleed 1/04 EGD with clip placed on gastric ulcer, paralytic d/c 1/07 TF held for high residuals??? 1/8 Cortrak placed, tip gastric 1/14 Cortrak clogged 1/15 plan to unclog cortrak or replace tube today. Noted wound will add Juven   Patient is currently intubated on ventilator support MV: 10.4 L/min Temp (24hrs), Avg:98 F (36.7 C), Min:97.2 F (36.2 C), Max:98.7 F (37.1 C)  Ffree water of 250 mL q 6 hours for hypernatremia   Admit weigh 69.3 kg; current wt 70.2 kg. Noted net negative 2 L per I/O flow sheet  Labs: potassium 3.3 (L), BUN/Cr: 65/2.07 (H) CBG's: 184-238 Meds: ascorbic acid, lasix, ss novolog, levemir, miralax, zinc sulfate D5 @ 50  Diet Order:   Diet Order            Diet NPO time specified  Diet effective now              EDUCATION NEEDS:   No education needs have been identified at this time  Skin:  Skin Assessment: Skin Integrity Issues: Skin Integrity Issues:: Stage I Stage I: L buttocks  Last BM:  1/13  Height:   Ht Readings from Last 1 Encounters:  03/27/19 5\' 3"  (1.6 m)    Weight:   Wt Readings from Last 1 Encounters:  04/05/19 76 kg    Ideal Body Weight:  56.3 kg  BMI:  Body mass index is 29.68 kg/m.  Estimated Nutritional Needs:   Kcal:  1820-2110 kcals  Protein:  105-140 g  Fluid:  2L/day  04/07/19 RD, LDN, CNSC [phone] Pager [phone] After Hours  Pager

## 2019-04-05 NOTE — Progress Notes (Signed)
NAME:  Marc Young, MRN:  [id number], DOB:  1946-03-21, LOS: 16 ADMISSION DATE:  04-03-2019, CONSULTATION DATE: 1/1 REFERRING MD: Dr. Randol Kern, CHIEF COMPLAINT: Acute respiratory failure  Brief History   74 yo male developed viral respiratory symptoms from 12/23.  Found to have COVID 19 pneumonia.  Treated with steroids, remdesivir, tocilizumab.  Required intubation 1/01.  Past Medical History  CKD 1, DM, HLD, HTN  Significant Hospital Events   1/1 VDRF, start nimbex 1/3 Transfer to [facility] from [facility] for evaluation of GI bleed 1/4 underwent EGD with clip placed on gastric ulcer with visible vessel; transition off nimbex 1/9 start pressure support  Consults:  GI >> s/o 1/05  Procedures:  ETT 1/1 >>  Rt PICC 1/2 >>   Significant Diagnostic Tests:  1/4 EGD >> 2 gastric ulcers clipped.  No high risk stigmata.  Micro Data:  SARS CoV2 12/30 >> positive Blood 12/30 >> negative C. difficile 12/31 >> negative Sputum 1/10 >>  COVID Therapy:  Remdesivir 12/30 >> 1/03 Tocilizumab 12/30   Antimicrobials:  Azithromycin 12/31 >> 1/06 Ceftriaxone 12/31 >> 1/05 Cefepime 1/10>>> Vanc 1/10>>>1/13  Interim history/subjective:  Intermittent desaturation overnight BP remains marginal and periodic episodes of bradycardia  Objective   Blood pressure 135/61, pulse 60, temperature 98.7 F (37.1 C), temperature source Oral, resp. rate (!) 32, height 5\' 3"  (1.6 m), weight 76 kg, SpO2 91 %.    Vent Mode: PRVC FiO2 (%):  [70 %-100 %] 90 % Set Rate:  [32 bmp] 32 bmp Vt Set:  [340 mL] 340 mL PEEP:  [14 cmH20-18 cmH20] 14 cmH20 Plateau Pressure:  [27 cmH20-30 cmH20] 30 cmH20   Intake/Output Summary (Last 24 hours) at 04/05/2019 04/07/2019 Last data filed at 04/05/2019 0756 Gross per 24 hour  Intake 2045.66 ml  Output 1135 ml  Net 910.66 ml   Filed Weights   04/03/19 0323 04/04/19 0341 04/05/19 0439  Weight: 65.9 kg 72.6 kg 76 kg   Examination:  General - Acutely ill appearing  male, NAD, on vent, sedate HEENT: [place]/AT, PERRL, EOM-I and MMM, ETT in place Cardiac - RRR, Nl S1/S2 and -M/R/G Chest - Decreased diffusely Abdomen - Soft, NT, ND and +BS Extremities - 1+ edema and -tenderness Skin - no rashes Neuro - Sedate, poorly responsive  I reviewed CXR myself, ETT is in a good position, infiltrate noted  Discussed with bedside RN and RT  Resolved Hospital Problem list:    Assessment & Plan:   Acute hypoxic respiratory failure from COVID 19 pneumonia with ARDS with possible bacterial HCAP. - Zaroxolyn 10 mg PO x1 - Lasix 40 mg IV q6 x3 doses - Free water 250 q6 - Albumin 25 q6 x4 doses - BMET in AM - Replace electrolytes as indicated, today K PO and IV - LCB with no CPR/cardioversion at this point - Sputum cultures negative, will D/C vanc but continue cefepime with stop date at 8 days since MRSA swab is negative - Not ready for extubation - F/u CXR and ABG in AM - No further proning given bradycardia with simple movement  ABLA from Gastric ulcer. Constipation. - Continue protonix for now - Hg down to 7.8, all anti-coagulation held but drop is not significant for risk of EGD (severe vagal response that can easily result in asystolic arrest) - CBC in AM - Transfuse if <7 - SCDs - Start SQ heparin  Septic shock: - Levophed for BP support - KVO IVF at this point severe fluid overload - Continue  abx - Cefepime - enterococcus in sputum  Hx of HTN, HLD. - Hold Coreg and pravastatin for now - D/C hydralazine  DM type 2. - SSI - D/C levemir since NPO  Hypernatremia. - Continue free water for now - Hold diureses given renal function  Hypokalemia. - Replace as indicated - BMET in AM  Acute metabolic encephalopathy from hypoxia. - RASS goal 0 to -1 - Continue klonopin, MSIR, seroquel  Extensive discussion with wife, LCB with no CPR/cardioversion, if shows any signs of improvement will consider trach  Best practice:  DVT prophylaxis:  lovenox SUP: protonix Nutrition: tube feeds Mobility: bed rest Goals of care: full code Disposition: ICU  Labs:   CMP Latest Ref Rng & Units 04/05/2019 04/05/2019 04/04/2019  Glucose 70 - 99 mg/dL 233(H) - -  BUN 8 - 23 mg/dL 65(H) - -  Creatinine 0.61 - 1.24 mg/dL 2.07(H) - -  Sodium 135 - 145 mmol/L 138 138 139  Potassium 3.5 - 5.1 mmol/L 3.3(L) 3.1(L) 3.5  Chloride 98 - 111 mmol/L 99 - -  CO2 22 - 32 mmol/L 27 - -  Calcium 8.9 - 10.3 mg/dL 8.9 - -  Total Protein 6.5 - 8.1 g/dL - - -  Total Bilirubin 0.3 - 1.2 mg/dL - - -  Alkaline Phos 38 - 126 U/L - - -  AST 15 - 41 U/L - - -  ALT 0 - 44 U/L - - -    CBC Latest Ref Rng & Units 04/05/2019 04/05/2019 04/04/2019  WBC 4.0 - 10.5 K/uL 3.4(L) - -  Hemoglobin 13.0 - 17.0 g/dL 9.0(L) 8.5(L) 8.8(L)  Hematocrit 39.0 - 52.0 % 28.6(L) 25.0(L) 26.0(L)  Platelets 150 - 400 K/uL 115(L) - -    ABG    Component Value Date/Time   PHART 7.408 04/05/2019 0414   PCO2ART 44.3 04/05/2019 0414   PO2ART 63.0 (L) 04/05/2019 0414   HCO3 28.0 04/05/2019 0414   TCO2 29 04/05/2019 0414   ACIDBASEDEF 2.0 03/27/2019 0300   O2SAT 92.0 04/05/2019 0414    CBG (last 3)  Recent Labs    04/04/19 2342 04/05/19 0351 04/05/19 0752  GLUCAP 179* 184* 238*   The patient is critically ill with multiple organ systems failure and requires high complexity decision making for assessment and support, frequent evaluation and titration of therapies, application of advanced monitoring technologies and extensive interpretation of multiple databases.   Critical Care Time devoted to patient care services described in this note is  32  Minutes. This time reflects time of care of this signee Dr Jennet Maduro. This critical care time does not reflect procedure time, or teaching time or supervisory time of PA/NP/Med student/Med Resident etc but could involve care discussion time.  Rush Farmer, M.D. [facility] Pulmonary/Critical Care Medicine.

## 2019-04-06 ENCOUNTER — Inpatient Hospital Stay (HOSPITAL_COMMUNITY): Payer: Medicare Other

## 2019-04-06 DIAGNOSIS — N179 Acute kidney failure, unspecified: Secondary | ICD-10-CM

## 2019-04-06 LAB — PHOSPHORUS: Phosphorus: 3.9 mg/dL (ref 2.5–4.6)

## 2019-04-06 LAB — POCT I-STAT 7, (LYTES, BLD GAS, ICA,H+H)
Acid-Base Excess: 4 mmol/L — ABNORMAL HIGH (ref 0.0–2.0)
Bicarbonate: 29.1 mmol/L — ABNORMAL HIGH (ref 20.0–28.0)
Calcium, Ion: 1.25 mmol/L (ref 1.15–1.40)
HCT: 30 % — ABNORMAL LOW (ref 39.0–52.0)
Hemoglobin: 10.2 g/dL — ABNORMAL LOW (ref 13.0–17.0)
O2 Saturation: 88 %
Patient temperature: 96.4
Potassium: 3.1 mmol/L — ABNORMAL LOW (ref 3.5–5.1)
Sodium: 136 mmol/L (ref 135–145)
TCO2: 30 mmol/L (ref 22–32)
pCO2 arterial: 43.7 mmHg (ref 32.0–48.0)
pH, Arterial: 7.426 (ref 7.350–7.450)
pO2, Arterial: 51 mmHg — ABNORMAL LOW (ref 83.0–108.0)

## 2019-04-06 LAB — CBC
HCT: 28.6 % — ABNORMAL LOW (ref 39.0–52.0)
Hemoglobin: 9 g/dL — ABNORMAL LOW (ref 13.0–17.0)
MCH: 30.3 pg (ref 26.0–34.0)
MCHC: 31.5 g/dL (ref 30.0–36.0)
MCV: 96.3 fL (ref 80.0–100.0)
Platelets: 127 10*3/uL — ABNORMAL LOW (ref 150–400)
RBC: 2.97 MIL/uL — ABNORMAL LOW (ref 4.22–5.81)
RDW: 16.2 % — ABNORMAL HIGH (ref 11.5–15.5)
WBC: 4 10*3/uL (ref 4.0–10.5)
nRBC: 0 % (ref 0.0–0.2)

## 2019-04-06 LAB — BASIC METABOLIC PANEL
Anion gap: 13 (ref 5–15)
BUN: 60 mg/dL — ABNORMAL HIGH (ref 8–23)
CO2: 28 mmol/L (ref 22–32)
Calcium: 8.9 mg/dL (ref 8.9–10.3)
Chloride: 98 mmol/L (ref 98–111)
Creatinine, Ser: 1.9 mg/dL — ABNORMAL HIGH (ref 0.61–1.24)
GFR calc Af Amer: 40 mL/min — ABNORMAL LOW (ref >60)
GFR calc non Af Amer: 34 mL/min — ABNORMAL LOW (ref >60)
Glucose, Bld: 254 mg/dL — ABNORMAL HIGH (ref 70–99)
Potassium: 3.1 mmol/L — ABNORMAL LOW (ref 3.5–5.1)
Sodium: 139 mmol/L (ref 135–145)

## 2019-04-06 LAB — GLUCOSE, CAPILLARY
Glucose-Capillary: 142 mg/dL — ABNORMAL HIGH (ref 70–99)
Glucose-Capillary: 143 mg/dL — ABNORMAL HIGH (ref 70–99)
Glucose-Capillary: 152 mg/dL — ABNORMAL HIGH (ref 70–99)
Glucose-Capillary: 177 mg/dL — ABNORMAL HIGH (ref 70–99)
Glucose-Capillary: 215 mg/dL — ABNORMAL HIGH (ref 70–99)
Glucose-Capillary: 229 mg/dL — ABNORMAL HIGH (ref 70–99)

## 2019-04-06 LAB — MAGNESIUM: Magnesium: 2.1 mg/dL (ref 1.7–2.4)

## 2019-04-06 MED ORDER — SODIUM CHLORIDE 0.9 % IV SOLN
2.0000 g | Freq: Four times a day (QID) | INTRAVENOUS | Status: DC
  Administered 2019-04-06 – 2019-04-07 (×5): 2 g via INTRAVENOUS
  Filled 2019-04-06: qty 2
  Filled 2019-04-06: qty 2000
  Filled 2019-04-06 (×3): qty 2
  Filled 2019-04-06 (×2): qty 2000

## 2019-04-06 MED ORDER — SODIUM CHLORIDE 0.9 % IV SOLN
INTRAVENOUS | Status: DC | PRN
  Administered 2019-04-06 – 2019-04-07 (×2): 500 mL via INTRAVENOUS

## 2019-04-06 MED ORDER — POTASSIUM CHLORIDE 10 MEQ/50ML IV SOLN
10.0000 meq | INTRAVENOUS | Status: AC
  Administered 2019-04-06 (×3): 10 meq via INTRAVENOUS
  Filled 2019-04-06 (×3): qty 50

## 2019-04-06 MED ORDER — METOCLOPRAMIDE HCL 5 MG/ML IJ SOLN
5.0000 mg | Freq: Four times a day (QID) | INTRAMUSCULAR | Status: AC
  Administered 2019-04-06 – 2019-04-07 (×6): 5 mg via INTRAVENOUS
  Filled 2019-04-06 (×6): qty 2

## 2019-04-06 MED ORDER — SODIUM CHLORIDE 0.9 % IV SOLN
INTRAVENOUS | Status: DC | PRN
  Administered 2019-04-06: 500 mL via INTRAVENOUS

## 2019-04-06 NOTE — Progress Notes (Signed)
Pharmacy Antibiotic Note  Marc Young is a 74 y.o. male admitted on 03/16/2019 with pneumonia, BAL culture growing pan-sensitive E.faecalis.  Pharmacy has been consulted for ampicillin dosing. Cefepime d/c'd. SCr trend down to 1.9.  Plan: Start ampicillin 2g IV q6h Monitor clinical progress, c/s, renal function F/u de-escalation plan/LOT  Height: _0  (160 cm) Weight: 164 lb 10.9 oz (74.7 kg) IBW/kg (Calculated) : 56.9  Temp (24hrs), Avg:97.4 F (36.3 C), Min:96.6 F (35.9 C), Max:98.2 F (36.8 C)  Recent Labs  Lab 04/03/19 0310 04/03/19 1225 04/03/19 1935 04/04/19 0410 04/04/19 1146 04/05/19 0557 04/06/19 0506  WBC 6.9   < > 4.6 4.4 4.2 3.4* 4.0  CREATININE 2.03*  --   --  2.19* 2.25* 2.07* 1.90*   < > = values in this interval not displayed.    Estimated Creatinine Clearance: 31.3 mL/min (A) (by C-G formula based on SCr of 1.9 mg/dL (H)).    Allergies  Allergen Reactions  . Benazepril Cough  . Hydrochlorothiazide Other (See Comments)    SIADH hyponatremia  . Lisinopril Cough  . Losartan Potassium Cough    Elicia Lamp, PharmD, BCPS Please check AMION for all [place] contact numbers Clinical Pharmacist 04/06/2019 9:57 AM

## 2019-04-06 NOTE — Progress Notes (Signed)
NAME:  Marc Young, MRN:  [id number], DOB:  1946-03-19, LOS: 53 ADMISSION DATE:  03/04/2019, CONSULTATION DATE: 1/1 REFERRING MD: Dr. Waldron Labs, CHIEF COMPLAINT: Acute respiratory failure  Brief History   74 yo male developed viral respiratory symptoms from 12/23.  Found to have COVID 19 pneumonia.  Treated with steroids, remdesivir, tocilizumab.  Required intubation 1/01.  Past Medical History  CKD 1, DM, HLD, HTN  Significant Hospital Events   1/1 VDRF, start nimbex 1/3 Transfer to [facility] from [facility] for evaluation of GI bleed 1/4 underwent EGD with clip placed on gastric ulcer with visible vessel; transition off nimbex 1/9 start pressure support  1/15Intermittent desaturation ,BP  marginal and periodic episodes of bradycardia  Consults:  GI >> s/o 1/05  Procedures:  ETT 1/1 >>  Rt PICC 1/2 >>   Significant Diagnostic Tests:  1/4 EGD >> 2 gastric ulcers clipped.  No high risk stigmata. CT angiogram chest 1/10 no PE, bilateral upper lobe groundglass opacity and lingular consolidation, multifocal pneumonia  Micro Data:  SARS CoV2 12/30 >> positive Blood 12/30 >> negative C. difficile 12/31 >> negative Sputum 1/10 >> enterococcus BAL 1/14 >>  COVID Therapy:  Remdesivir 12/30 >> 1/03 Tocilizumab 12/30   Antimicrobials:  Azithromycin 12/31 >> 1/06 Ceftriaxone 12/31 >> 1/05 Cefepime 1/10>>> Vanc 1/10>>>1/13  Interim history/subjective:   Remains critically ill, intubated, On 80%/PEEP of 14 Intermittent bradycardia on movement or suctioning per RN Sedated on fentanyl and Versed Abdominal distention noted  Objective   Blood pressure (!) 164/70, pulse 74, temperature 97.6 F (36.4 C), temperature source Axillary, resp. rate (!) 32, height _0  (1.6 m), weight 74.7 kg, SpO2 93 %.    Vent Mode: PRVC FiO2 (%):  [70 %-80 %] 80 % Set Rate:  [32 bmp] 32 bmp Vt Set:  [340 mL] 340 mL PEEP:  [14 cmH20] 14 cmH20 Plateau Pressure:  [29 cmH20-35 cmH20] 33 cmH20     Intake/Output Summary (Last 24 hours) at 04/06/2019 1791 Last data filed at 04/06/2019 0800 Gross per 24 hour  Intake 2434.58 ml  Output 2325 ml  Net 109.58 ml   Filed Weights   04/04/19 0341 04/05/19 0439 04/06/19 0421  Weight: 72.6 kg 76 kg 74.7 kg   Examination:  General - Acutely ill appearing male, NAD, on vent, sedate HEENT: [place]/AT, PERRL, EOM-I and MMM, ETT in place Cardiac - RRR, Nl S1/S2 and -M/R/G Chest -bilateral scattered crackles, peak pressure 38, plateau 27, driving 13 Abdomen - Soft, NT, ND and +BS Extremities - 1+ edema and -tenderness Skin - no rashes Neuro - Sedated, RASS -4  Chest x-ray 1/16 personally reviewed shows worsening bilateral interstitial and alveolar infiltrates  Labs show hypokalemia, slight decrease in creatinine to 1.9, stable anemia, no leukocytosis, improving thrombocytopenia  Resolved Hospital Problem list:    Assessment & Plan:   ARDS from COVID 19 pneumonia   -Continue lung protective ventilation with goal plateau pressure less than 30, driving pressure less than 15 - No further proning given bradycardia with simple movement  Septic shock: GNR HCAP -Enterococcus  -Changed to ampicillin from cefepime -Off pressors  ABLA from Gastric ulcer. - Continue protonix for now -  Transfuse if <7 - SCDs - Ct SQ heparin  Abdominal distention/constipation -Tube feeds held -Reglan 5 every 8 for 6 doses -X-ray abdomen   Hx of HTN, HLD. - Hold Coreg and pravastatin for now - D/C hydralazine  DM type 2. - SSI - D/C levemir since NPO -Now hyperglycemic, DC  D10   AKI - baseline cr 0.7 - Lasix 40 mg IV q6 x3 doses - Free water 250 q6 Dc albumin & zaroxlyn  Hypernatremia. -resolved  Hypokalemia. - Replace   Acute metabolic encephalopathy from hypoxia. - RASS goal -3 while on high PEEP with Versed/fentanyl - Continue klonopin, MSIR, seroquel  Per discussion with wife on 1/15, LCB with no CPR/cardioversion, tracheostomy  still remains on the table, full medical care  Best practice:  DVT prophylaxis: lovenox SUP: protonix Nutrition: tube feeds Mobility: bed rest Goals of care: full code Disposition: ICU   The patient is critically ill with multiple organ systems failure and requires high complexity decision making for assessment and support, frequent evaluation and titration of therapies, application of advanced monitoring technologies and extensive interpretation of multiple databases. Critical Care Time devoted to patient care services described in this note independent of APP/resident  time is 35 minutes.    Kara Mead MD. [place]. [place] Pulmonary & Critical care  If no response to pager , please call [phone]   04/06/2019

## 2019-04-07 ENCOUNTER — Inpatient Hospital Stay (HOSPITAL_COMMUNITY): Payer: Medicare Other

## 2019-04-07 LAB — POCT I-STAT 7, (LYTES, BLD GAS, ICA,H+H)
Acid-Base Excess: 4 mmol/L — ABNORMAL HIGH (ref 0.0–2.0)
Bicarbonate: 29.4 mmol/L — ABNORMAL HIGH (ref 20.0–28.0)
Calcium, Ion: 1.24 mmol/L (ref 1.15–1.40)
HCT: 27 % — ABNORMAL LOW (ref 39.0–52.0)
Hemoglobin: 9.2 g/dL — ABNORMAL LOW (ref 13.0–17.0)
O2 Saturation: 94 %
Patient temperature: 98.2
Potassium: 3.3 mmol/L — ABNORMAL LOW (ref 3.5–5.1)
Sodium: 140 mmol/L (ref 135–145)
TCO2: 31 mmol/L (ref 22–32)
pCO2 arterial: 44.4 mmHg (ref 32.0–48.0)
pH, Arterial: 7.428 (ref 7.350–7.450)
pO2, Arterial: 68 mmHg — ABNORMAL LOW (ref 83.0–108.0)

## 2019-04-07 LAB — CULTURE, BAL-QUANTITATIVE W GRAM STAIN

## 2019-04-07 LAB — CBC
HCT: 30.9 % — ABNORMAL LOW (ref 39.0–52.0)
Hemoglobin: 9.6 g/dL — ABNORMAL LOW (ref 13.0–17.0)
MCH: 30 pg (ref 26.0–34.0)
MCHC: 31.1 g/dL (ref 30.0–36.0)
MCV: 96.6 fL (ref 80.0–100.0)
Platelets: 166 10*3/uL (ref 150–400)
RBC: 3.2 MIL/uL — ABNORMAL LOW (ref 4.22–5.81)
RDW: 16.2 % — ABNORMAL HIGH (ref 11.5–15.5)
WBC: 5 10*3/uL (ref 4.0–10.5)
nRBC: 0 % (ref 0.0–0.2)

## 2019-04-07 LAB — GLUCOSE, CAPILLARY
Glucose-Capillary: 114 mg/dL — ABNORMAL HIGH (ref 70–99)
Glucose-Capillary: 129 mg/dL — ABNORMAL HIGH (ref 70–99)
Glucose-Capillary: 129 mg/dL — ABNORMAL HIGH (ref 70–99)
Glucose-Capillary: 156 mg/dL — ABNORMAL HIGH (ref 70–99)
Glucose-Capillary: 158 mg/dL — ABNORMAL HIGH (ref 70–99)
Glucose-Capillary: 170 mg/dL — ABNORMAL HIGH (ref 70–99)

## 2019-04-07 LAB — BASIC METABOLIC PANEL
Anion gap: 15 (ref 5–15)
BUN: 62 mg/dL — ABNORMAL HIGH (ref 8–23)
CO2: 26 mmol/L (ref 22–32)
Calcium: 9.2 mg/dL (ref 8.9–10.3)
Chloride: 98 mmol/L (ref 98–111)
Creatinine, Ser: 2.28 mg/dL — ABNORMAL HIGH (ref 0.61–1.24)
GFR calc Af Amer: 32 mL/min — ABNORMAL LOW (ref >60)
GFR calc non Af Amer: 27 mL/min — ABNORMAL LOW (ref >60)
Glucose, Bld: 131 mg/dL — ABNORMAL HIGH (ref 70–99)
Potassium: 3 mmol/L — ABNORMAL LOW (ref 3.5–5.1)
Sodium: 139 mmol/L (ref 135–145)

## 2019-04-07 LAB — MAGNESIUM: Magnesium: 2.3 mg/dL (ref 1.7–2.4)

## 2019-04-07 LAB — PHOSPHORUS: Phosphorus: 4.3 mg/dL (ref 2.5–4.6)

## 2019-04-07 LAB — TRIGLYCERIDES: Triglycerides: 177 mg/dL — ABNORMAL HIGH (ref <150)

## 2019-04-07 MED ORDER — MORPHINE SULFATE 15 MG PO TABS
15.0000 mg | ORAL_TABLET | Freq: Four times a day (QID) | ORAL | Status: DC
  Administered 2019-04-08 – 2019-04-10 (×8): 15 mg
  Filled 2019-04-07 (×8): qty 1

## 2019-04-07 MED ORDER — SODIUM CHLORIDE 0.9 % IV SOLN
2.0000 g | Freq: Three times a day (TID) | INTRAVENOUS | Status: DC
  Administered 2019-04-07 – 2019-04-10 (×9): 2 g via INTRAVENOUS
  Filled 2019-04-07: qty 2
  Filled 2019-04-07 (×2): qty 2000
  Filled 2019-04-07 (×2): qty 2
  Filled 2019-04-07 (×2): qty 2000
  Filled 2019-04-07 (×2): qty 2
  Filled 2019-04-07: qty 2000
  Filled 2019-04-07: qty 2

## 2019-04-07 MED ORDER — POTASSIUM CHLORIDE 10 MEQ/50ML IV SOLN
10.0000 meq | INTRAVENOUS | Status: AC
  Administered 2019-04-07 (×6): 10 meq via INTRAVENOUS
  Filled 2019-04-07 (×6): qty 50

## 2019-04-07 MED ORDER — HYDRALAZINE HCL 20 MG/ML IJ SOLN
20.0000 mg | INTRAMUSCULAR | Status: DC | PRN
  Administered 2019-04-07: 30 mg via INTRAVENOUS
  Administered 2019-04-07: 20 mg via INTRAVENOUS
  Filled 2019-04-07: qty 2

## 2019-04-07 MED ORDER — NOREPINEPHRINE 16 MG/250ML-% IV SOLN
0.0000 ug/min | INTRAVENOUS | Status: DC
  Administered 2019-04-07 – 2019-04-09 (×2): 2 ug/min via INTRAVENOUS
  Administered 2019-04-10: 40 ug/min via INTRAVENOUS
  Filled 2019-04-07 (×3): qty 250

## 2019-04-07 MED ORDER — PROPOFOL 1000 MG/100ML IV EMUL
5.0000 ug/kg/min | INTRAVENOUS | Status: DC
  Administered 2019-04-07: 60 ug/kg/min via INTRAVENOUS
  Administered 2019-04-07: 30 ug/kg/min via INTRAVENOUS
  Administered 2019-04-07: 60 ug/kg/min via INTRAVENOUS
  Administered 2019-04-07: 5 ug/kg/min via INTRAVENOUS
  Administered 2019-04-08 (×2): 60 ug/kg/min via INTRAVENOUS
  Administered 2019-04-08: 50 ug/kg/min via INTRAVENOUS
  Administered 2019-04-08: 40 ug/kg/min via INTRAVENOUS
  Administered 2019-04-08 (×2): 60 ug/kg/min via INTRAVENOUS
  Administered 2019-04-08: 59.982 ug/kg/min via INTRAVENOUS
  Administered 2019-04-08 – 2019-04-10 (×8): 60 ug/kg/min via INTRAVENOUS
  Administered 2019-04-10 (×2): 79.903 ug/kg/min via INTRAVENOUS
  Administered 2019-04-10: 80 ug/kg/min via INTRAVENOUS
  Administered 2019-04-10: 70 ug/kg/min via INTRAVENOUS
  Filled 2019-04-07 (×8): qty 100
  Filled 2019-04-07: qty 200
  Filled 2019-04-07 (×13): qty 100

## 2019-04-07 NOTE — Progress Notes (Signed)
RN paged to inform that she spoke to AM-CCM MD Dr Marcos Eke and got order for levophed -patent declning, brady and hypotensive. She will update family. No CPR if arrests    SIGNATURE    Dr. Kalman Shan, M.D., F.C.C.P,  Pulmonary and Critical Care Medicine Staff Physician, [facility] Director - Interstitial Lung Disease  Program  Pulmonary Fibrosis [facility] at [facility] [place], [place], [address]  Pager: [phone], If no answer or between  15:00h - 7:00h: call [phone] Telephone: [phone]  4:41 PM 04/07/2019

## 2019-04-07 NOTE — Progress Notes (Signed)
Spoke with wife and updated her on patient status.

## 2019-04-07 NOTE — Progress Notes (Signed)
Pharmacy Antibiotic Note  Marc Young is a 74 y.o. male admitted on 03/06/2019 with pneumonia, BAL culture growing pan-sensitive E.faecalis.  Pharmacy has been consulted for ampicillin dosing. Cefepime d/c'd. SCr trend back up to 2.28  Plan: Adjust ampicillin to 2g IV q8h Monitor clinical progress, c/s, renal function F/u de-escalation plan/LOT  Height: _0  (160 cm) Weight: 166 lb 0.1 oz (75.3 kg) IBW/kg (Calculated) : 56.9  Temp (24hrs), Avg:98 F (36.7 C), Min:97.6 F (36.4 C), Max:98.2 F (36.8 C)  Recent Labs  Lab 04/04/19 0410 04/04/19 1146 04/05/19 0557 04/06/19 0506 04/07/19 0517  WBC 4.4 4.2 3.4* 4.0 5.0  CREATININE 2.19* 2.25* 2.07* 1.90* 2.28*    Estimated Creatinine Clearance: 26.2 mL/min (A) (by C-G formula based on SCr of 2.28 mg/dL (H)).    Allergies  Allergen Reactions  . Benazepril Cough  . Hydrochlorothiazide Other (See Comments)    SIADH hyponatremia  . Lisinopril Cough  . Losartan Potassium Cough    Elicia Lamp, PharmD, BCPS Please check AMION for all [place] contact numbers Clinical Pharmacist 04/07/2019 1:07 PM

## 2019-04-07 NOTE — Progress Notes (Signed)
eLink Physician-Brief Progress Note Patient Name: Marc Young DOB: [birth year] MRN: [id number]   Date of Service  04/07/2019  HPI/Events of Note  Sub-optimal sedation on Versed infusion at 10 mg/ hour.  eICU Interventions  Versed discontinued, and Propofol substituted.        Marc Young 04/07/2019, 6:50 AM

## 2019-04-07 NOTE — Progress Notes (Addendum)
Notified MD that unable to keep patients synchronized on the vent without his blood pressure dropping to 72/36 (46), RR 50s.  Orders received to start Levophed.  Will continue to monitor patient.

## 2019-04-07 NOTE — Progress Notes (Signed)
NAME:  Marc Young, MRN:  [id number], DOB:  [birth year], LOS: 7 ADMISSION DATE:  03/17/2019, CONSULTATION DATE: 1/1 REFERRING MD: Dr. Waldron Labs, CHIEF COMPLAINT: Acute respiratory failure  Brief History   74 yo male developed viral respiratory symptoms from 12/23.  Found to have COVID 19 pneumonia.  Treated with steroids, remdesivir, tocilizumab.  Required intubation 1/01.  Past Medical History  CKD 1, DM, HLD, HTN  Significant Hospital Events   1/1 VDRF, start nimbex 1/3 Transfer to [facility] from [facility] for evaluation of GI bleed 1/4 underwent EGD with clip placed on gastric ulcer with visible vessel; transition off nimbex 1/9 start pressure support  1/15 Intermittent desaturation ,BP  marginal and periodic episodes of bradycardia 1/17: remains very tenuous, with desaturations, tachycardia with slight movement. Distended abdomen and minimal bowel sounds despite nl axr yesterday.   Consults:  GI >> s/o 1/05  Procedures:  ETT 1/1 >>  Rt PICC 1/2 >>   Significant Diagnostic Tests:  1/4 EGD >> 2 gastric ulcers clipped.  No high risk stigmata. CT angiogram chest 1/10 no PE, bilateral upper lobe groundglass opacity and lingular consolidation, multifocal pneumonia  Micro Data:  SARS CoV2 12/30 >> positive Blood 12/30 >> negative C. difficile 12/31 >> negative Sputum 1/10 >> enterococcus BAL 1/14 >>  COVID Therapy:  Remdesivir 12/30 >> 1/03 Tocilizumab 12/30   Antimicrobials:  Azithromycin 12/31 >> 1/06 Ceftriaxone 12/31 >> 1/05 Cefepime 1/10>>> Vanc 1/10>>>1/13 Ampicillin 1/16-->  Interim history/subjective:   Remains critically ill, intubated, On 70%/PEEP of 14 Intermittent agitation, desaturations on movement or suctioning per RN Sedated on fentanyl and propofol Started propofol overnight/early morning due to agitation on versed 74m.  Also started on prn hydral.  Abdominal distention noted, no ileus or bowel obstruction noted on AXR yesterday  Objective    Blood pressure (!) 113/55, pulse 98, temperature 98.2 F (36.8 C), temperature source Axillary, resp. rate (!) 37, height _0  (1.6 m), weight 75.3 kg, SpO2 90 %.    Vent Mode: PRVC FiO2 (%):  [70 %-100 %] 70 % Set Rate:  [32 bmp] 32 bmp Vt Set:  [340 mL] 340 mL PEEP:  [14 cmH20] 14 cmH20 Plateau Pressure:  [22 cmH20-34 cmH20] 30 cmH20   Intake/Output Summary (Last 24 hours) at 04/07/2019 0857 Last data filed at 04/07/2019 0800 Gross per 24 hour  Intake 2212.84 ml  Output 1240 ml  Net 972.84 ml   Filed Weights   04/05/19 0439 04/06/19 0421 04/07/19 0500  Weight: 76 kg 74.7 kg 75.3 kg   Examination:  General - Acutely ill appearing male, NAD, on vent, sedated HEENT: [place]/AT, PERRL,MMM, ETT in place, occasional leak audible, pilot balloon fully inflated.  Cardiac - RRR, Nl S1/S2 and -M/R/G Chest -bilateral scattered crackles, peak pressure 30- 32, plateau 27, driving 15 Abdomen - Soft, NT, ND and minimal BS  Extremities - 1+ edema and -tenderness Skin - no rashes Neuro - Sedated, RASS -4  Chest x-ray 1/16 personally reviewed shows slightly improved bilateral interstitial and alveolar infiltrates (stable per radiology read)  Labs show hypokalemia, slight increase in creatinine to >2, stable anemia, no leukocytosis, improving thrombocytopenia  Resolved Hospital Problem list:    Assessment & Plan:   ARDS from COVID 19 pneumonia  PaO2 slightly improved today.  Cont current vent settings.   In danger of needing tube exchange given prolonged intubation, but given his oxygen and peep needs this will be very dangerous.  -Continue lung protective ventilation with goal plateau pressure less than  30, driving pressure less than 15 - No further proning given bradycardia with simple movement  Septic shock: GNR HCAP -Enterococcus -Cont ampicillin -Off pressors  S/p GI bleed, acute blood loss anemia, Gastric ulcer. - Continue protonix for now -  Transfuse if <7 - SCDs - Ct SQ  heparin  Abdominal distention/constipation Possibly 2/2 opiates? NO ileus on bowel obstruction radiographically.   Unfortunately sedation has been necessary for high vent demands and difficult to achieve.  On fentanyl and morphine po, though morphine PO and has been held since 1/15.    -Tube feeds held -Reglan 5 every 8 for 6 doses Bowel regimen (cont).    Hx of HTN, HLD. BP stable now.   - Hold Coreg and pravastatin for now -on clonidine patch - hydralazine prn  DM type 2. - SSI - D/C levemir since NPO -BS ok now, D10 held since yesterday.    AKI - baseline cr 0.7 - Cr increasing up to 2.28 today.  -Lasix and free water have been held.   Dc albumin & zaroxlyn  Hypernatremia. -resolved  Hypokalemia. - Replace with KCL as needed. 24mq ordered for today.   Acute metabolic encephalopathy from hypoxia. - RASS goal -3 while on high PEEP with Versed/fentanyl - Continue klonopin, MSIR, seroquel  Per discussion with wife on 1/15, LCB with no CPR/cardioversion, tracheostomy still remains on the table, full medical care.   Best practice:  DVT prophylaxis: heparin TID SUP: protonix Nutrition: tube feeds - held since 1/16 Mobility: bed rest Goals of care: DNR, intubation ok  Disposition: ICU   The patient is critically ill with multiple organ systems failure and requires high complexity decision making for assessment and support, frequent evaluation and titration of therapies, application of advanced monitoring technologies and extensive interpretation of multiple databases. Critical Care Time devoted to patient care services described in this note independent of APP/resident  time is 35 minutes.    NCollier Bullock MD [facility]  04/07/2019

## 2019-04-07 NOTE — Progress Notes (Addendum)
eLink Physician-Brief Progress Note Patient Name: Marc Young DOB: Sep 16, 1945 MRN: [id number]   Date of Service  04/07/2019  HPI/Events of Note  Sub-optimal blood pressure control  eICU Interventions  PRN Hydralazine changed to Q 4 hours from Q 6 hours.        Thomasene Lot Branda Chaudhary 04/07/2019, 6:09 AM

## 2019-04-08 ENCOUNTER — Inpatient Hospital Stay (HOSPITAL_COMMUNITY): Payer: Medicare Other

## 2019-04-08 DIAGNOSIS — J9611 Chronic respiratory failure with hypoxia: Secondary | ICD-10-CM

## 2019-04-08 LAB — POCT I-STAT 7, (LYTES, BLD GAS, ICA,H+H)
Bicarbonate: 25.5 mmol/L (ref 20.0–28.0)
Calcium, Ion: 1.26 mmol/L (ref 1.15–1.40)
HCT: 28 % — ABNORMAL LOW (ref 39.0–52.0)
Hemoglobin: 9.5 g/dL — ABNORMAL LOW (ref 13.0–17.0)
O2 Saturation: 98 %
Patient temperature: 97.2
Potassium: 3.8 mmol/L (ref 3.5–5.1)
Sodium: 137 mmol/L (ref 135–145)
TCO2: 27 mmol/L (ref 22–32)
pCO2 arterial: 41.8 mmHg (ref 32.0–48.0)
pH, Arterial: 7.39 (ref 7.350–7.450)
pO2, Arterial: 95 mmHg (ref 83.0–108.0)

## 2019-04-08 LAB — COMPREHENSIVE METABOLIC PANEL
ALT: 25 U/L (ref 0–44)
AST: 16 U/L (ref 15–41)
Albumin: 2.5 g/dL — ABNORMAL LOW (ref 3.5–5.0)
Alkaline Phosphatase: 65 U/L (ref 38–126)
Anion gap: 52 — ABNORMAL HIGH (ref 5–15)
BUN: 64 mg/dL — ABNORMAL HIGH (ref 8–23)
CO2: 19 mmol/L — ABNORMAL LOW (ref 22–32)
Calcium: 5.9 mg/dL — CL (ref 8.9–10.3)
Chloride: 86 mmol/L — ABNORMAL LOW (ref 98–111)
Creatinine, Ser: 2.56 mg/dL — ABNORMAL HIGH (ref 0.61–1.24)
GFR calc Af Amer: 28 mL/min — ABNORMAL LOW (ref >60)
GFR calc non Af Amer: 24 mL/min — ABNORMAL LOW (ref >60)
Glucose, Bld: 137 mg/dL — ABNORMAL HIGH (ref 70–99)
Potassium: 3.4 mmol/L — ABNORMAL LOW (ref 3.5–5.1)
Sodium: 157 mmol/L — ABNORMAL HIGH (ref 135–145)
Total Bilirubin: 1 mg/dL (ref 0.3–1.2)
Total Protein: 4.9 g/dL — ABNORMAL LOW (ref 6.5–8.1)

## 2019-04-08 LAB — BASIC METABOLIC PANEL
Anion gap: 16 — ABNORMAL HIGH (ref 5–15)
BUN: 69 mg/dL — ABNORMAL HIGH (ref 8–23)
CO2: 24 mmol/L (ref 22–32)
Calcium: 9.2 mg/dL (ref 8.9–10.3)
Chloride: 101 mmol/L (ref 98–111)
Creatinine, Ser: 2.91 mg/dL — ABNORMAL HIGH (ref 0.61–1.24)
GFR calc Af Amer: 24 mL/min — ABNORMAL LOW (ref >60)
GFR calc non Af Amer: 20 mL/min — ABNORMAL LOW (ref >60)
Glucose, Bld: 145 mg/dL — ABNORMAL HIGH (ref 70–99)
Potassium: 3.8 mmol/L (ref 3.5–5.1)
Sodium: 141 mmol/L (ref 135–145)

## 2019-04-08 LAB — GLUCOSE, CAPILLARY
Glucose-Capillary: 146 mg/dL — ABNORMAL HIGH (ref 70–99)
Glucose-Capillary: 136 mg/dL — ABNORMAL HIGH (ref 70–99)
Glucose-Capillary: 153 mg/dL — ABNORMAL HIGH (ref 70–99)
Glucose-Capillary: 116 mg/dL — ABNORMAL HIGH (ref 70–99)
Glucose-Capillary: 168 mg/dL — ABNORMAL HIGH (ref 70–99)
Glucose-Capillary: 142 mg/dL — ABNORMAL HIGH (ref 70–99)

## 2019-04-08 LAB — CBC
HCT: 31.2 % — ABNORMAL LOW (ref 39.0–52.0)
Hemoglobin: 9.6 g/dL — ABNORMAL LOW (ref 13.0–17.0)
MCH: 30.1 pg (ref 26.0–34.0)
MCHC: 30.8 g/dL (ref 30.0–36.0)
MCV: 97.8 fL (ref 80.0–100.0)
Platelets: 244 10*3/uL (ref 150–400)
RBC: 3.19 MIL/uL — ABNORMAL LOW (ref 4.22–5.81)
RDW: 16.7 % — ABNORMAL HIGH (ref 11.5–15.5)
WBC: 6.8 10*3/uL (ref 4.0–10.5)
nRBC: 0 % (ref 0.0–0.2)

## 2019-04-08 LAB — TRIGLYCERIDES: Triglycerides: 199 mg/dL — ABNORMAL HIGH (ref <150)

## 2019-04-08 MED ORDER — SODIUM CHLORIDE 0.45 % IV SOLN: INTRAVENOUS | Status: DC

## 2019-04-08 MED ORDER — METOCLOPRAMIDE HCL 5 MG/ML IJ SOLN
5.0000 mg | Freq: Four times a day (QID) | INTRAMUSCULAR | Status: DC
  Administered 2019-04-08 – 2019-04-10 (×10): 5 mg via INTRAVENOUS
  Filled 2019-04-08 (×10): qty 2

## 2019-04-08 MED ORDER — METHYLNALTREXONE BROMIDE 12 MG/0.6ML ~~LOC~~ SOLN
6.0000 mg | Freq: Once | SUBCUTANEOUS | Status: AC
  Administered 2019-04-08: 6 mg via SUBCUTANEOUS
  Filled 2019-04-08: qty 0.6

## 2019-04-08 MED ORDER — SODIUM CHLORIDE 0.45 % IV BOLUS
250.0000 mL | Freq: Once | INTRAVENOUS | Status: AC
  Administered 2019-04-08: 250 mL via INTRAVENOUS

## 2019-04-08 MED ORDER — SIMETHICONE 40 MG/0.6ML PO SUSP
40.0000 mg | Freq: Four times a day (QID) | ORAL | Status: DC
  Filled 2019-04-08 (×2): qty 0.6

## 2019-04-08 MED ORDER — VITAL HIGH PROTEIN PO LIQD
1000.0000 mL | ORAL | Status: DC
  Administered 2019-04-08: 1000 mL

## 2019-04-08 NOTE — Progress Notes (Signed)
NAME:  Marc Young, MRN:  [id number], DOB:  1945-04-08, LOS: 88 ADMISSION DATE:  03/14/2019, CONSULTATION DATE: 1/1 REFERRING MD: Dr. Waldron Labs, CHIEF COMPLAINT: Acute respiratory failure  Brief History   74 yo male developed viral respiratory symptoms from 12/23.  Found to have COVID 19 pneumonia.  Treated with steroids, remdesivir, tocilizumab.  Required intubation 1/01.  Past Medical History  CKD 1, DM, HLD, HTN  Significant Hospital Events   1/1 VDRF, start nimbex 1/3 Transfer to [facility] from [facility] for evaluation of GI bleed 1/4 underwent EGD with clip placed on gastric ulcer with visible vessel; transition off nimbex 1/9 start pressure support  1/15 Intermittent desaturation ,BP  marginal and periodic episodes of bradycardia 1/17: remains very tenuous, with desaturations, tachycardia with slight movement. Distended abdomen and minimal bowel sounds despite nl axr yesterday.   Consults:  GI >> s/o 1/05  Procedures:  ETT 1/1 >>  Rt PICC 1/2 >>   Significant Diagnostic Tests:  1/4 EGD >> 2 gastric ulcers clipped.  No high risk stigmata. CT angiogram chest 1/10 no PE, bilateral upper lobe groundglass opacity and lingular consolidation, multifocal pneumonia  Micro Data:  SARS CoV2 12/30 >> positive Blood 12/30 >> negative C. difficile 12/31 >> negative Sputum 1/10 >> enterococcus BAL 1/14 >> Enterococcus  COVID Therapy:  Remdesivir 12/30 >> 1/03 Tocilizumab 12/30   Antimicrobials:  Azithromycin 12/31-1/06 Ceftriaxone 12/31-1/05 Cefepime 1/10>>> Vanc 1/10>>>1/13 Ampicillin 1/16-->  Interim history/subjective:   Remains critically ill Still with high vent requirements Abdominal distention persists   Objective   Blood pressure (!) 141/63, pulse 89, temperature 97.7 F (36.5 C), temperature source Axillary, resp. rate (!) 33, height _0  (1.6 m), weight 77.2 kg, SpO2 (!) 89 %.    Vent Mode: PRVC FiO2 (%):  [70 %] 70 % Set Rate:  [32 bmp] 32 bmp Vt  Set:  [340 mL] 340 mL PEEP:  [14 cmH20] 14 cmH20 Plateau Pressure:  [30 cmH20-33 cmH20] 30 cmH20   Intake/Output Summary (Last 24 hours) at 04/08/2019 0926 Last data filed at 04/08/2019 0900 Gross per 24 hour  Intake 2237.36 ml  Output 2675 ml  Net -437.64 ml   Filed Weights   04/06/19 0421 04/07/19 0500 04/08/19 0429  Weight: 74.7 kg 75.3 kg 77.2 kg   Examination:  General -acutely ill-appearing, sedated HEENT: Pupils equal and reacting, endotracheal tube in place  cardiac -S1-S2 appreciated Chest -bilateral crackles  Abdomen -distended, soft, bowel sounds appreciated Extremities - 1+ edema and -tenderness Skin - no rashes Neuro -sedated, does not respond to verbal command  Chest x-ray 1/18-reviewed by myself showing improvement in bilateral infiltrative process   Resolved Hospital Problem list:    Assessment & Plan:   ARDS from Covid pneumonia -Continue full vent support -Lung protective ventilation -Maintain driving pressure less than 15 -Not a proning candidate given bradycardia   Septic shock -Currently off pressors -Continue ampicillin -Follow cultures -HCAP-Enterococcus-improving infiltrative process on chest x-ray  S/p GI bleed, acute blood loss anemia, gastric ulcer -Continue Protonix -Transfuse per protocol -SCDs -Subcu heparin  Abdominal distention/constipation -No significant gas -Continue Reglan -Continue bowel regimen -May be related to opiates  History of hypertension, hyperlipidemia BP stable -Coreg on hold -Hydralazine as needed  Type 2 diabetes -SSI -Levemir on hold since patient n.p.o.  AKI -Baseline creatinine of 0.7 -Continue to trend -Concern with elevated creatinine, hold diuresis  Hypokalemia -Replete as needed  Abnormal labs with sodium and calcium significantly abnormal from less than 24 hours earlier This  appears to be spurious -Will repeat labs  Metabolic encephalopathy secondary to hypoxia -RASS goal  -1-2  Significant constipation No significant gaseous pattern in the abdomen May be related to opiates -Will give him a dose of Relistor -May be repeated 1/20  Per discussion documented 1/15-no CPR/cardioversion Tracheostomy remains on the table, continue aggressive care   Best practice:  DVT prophylaxis: heparin TID SUP: protonix Nutrition: tube feeds - held since 1/16 Mobility: bed rest Goals of care: DNR, intubation ok  Disposition: ICU  The patient is critically ill with multiple organ systems failure and requires high complexity decision making for assessment and support, frequent evaluation and titration of therapies, application of advanced monitoring technologies and extensive interpretation of multiple databases. Critical Care Time devoted to patient care services described in this note independent of APP/resident time (if applicable)  is 40 minutes.   Sherrilyn Rist MD [place] [facility] Personal pager: [phone] If unanswered, please page CCM On-call: [phone]

## 2019-04-08 NOTE — Progress Notes (Signed)
CRITICAL VALUE ALERT  Critical Value:  Ca 5.9  Date & Time Notied:  04/08/2019 0630  Provider Notified: Dr. Dellie Catholic via Bobette Mo RN  Orders Received/Actions taken: awaiting new order

## 2019-04-08 NOTE — Progress Notes (Signed)
Updated patients spouse about his condition  Discussed need for tracheostomy  Will update again tomorrow

## 2019-04-08 NOTE — Progress Notes (Signed)
Pt's wife picked up pt's belongings at front desk delivered by [employer], [place].

## 2019-04-09 ENCOUNTER — Inpatient Hospital Stay (HOSPITAL_COMMUNITY): Payer: Medicare Other

## 2019-04-09 LAB — BASIC METABOLIC PANEL
Anion gap: 14 (ref 5–15)
BUN: 82 mg/dL — ABNORMAL HIGH (ref 8–23)
CO2: 24 mmol/L (ref 22–32)
Calcium: 8.7 mg/dL — ABNORMAL LOW (ref 8.9–10.3)
Chloride: 100 mmol/L (ref 98–111)
Creatinine, Ser: 2.74 mg/dL — ABNORMAL HIGH (ref 0.61–1.24)
GFR calc Af Amer: 25 mL/min — ABNORMAL LOW (ref >60)
GFR calc non Af Amer: 22 mL/min — ABNORMAL LOW (ref >60)
Glucose, Bld: 149 mg/dL — ABNORMAL HIGH (ref 70–99)
Potassium: 3.6 mmol/L (ref 3.5–5.1)
Sodium: 138 mmol/L (ref 135–145)

## 2019-04-09 LAB — CBC
HCT: 29.4 % — ABNORMAL LOW (ref 39.0–52.0)
Hemoglobin: 9.2 g/dL — ABNORMAL LOW (ref 13.0–17.0)
MCH: 30.6 pg (ref 26.0–34.0)
MCHC: 31.3 g/dL (ref 30.0–36.0)
MCV: 97.7 fL (ref 80.0–100.0)
Platelets: 183 10*3/uL (ref 150–400)
RBC: 3.01 MIL/uL — ABNORMAL LOW (ref 4.22–5.81)
RDW: 16 % — ABNORMAL HIGH (ref 11.5–15.5)
WBC: 7.1 10*3/uL (ref 4.0–10.5)
nRBC: 0 % (ref 0.0–0.2)

## 2019-04-09 LAB — PHOSPHORUS
Phosphorus: 6.1 mg/dL — ABNORMAL HIGH (ref 2.5–4.6)
Phosphorus: 6.5 mg/dL — ABNORMAL HIGH (ref 2.5–4.6)

## 2019-04-09 LAB — MAGNESIUM
Magnesium: 2.5 mg/dL — ABNORMAL HIGH (ref 1.7–2.4)
Magnesium: 2.4 mg/dL (ref 1.7–2.4)

## 2019-04-09 LAB — GLUCOSE, CAPILLARY
Glucose-Capillary: 146 mg/dL — ABNORMAL HIGH (ref 70–99)
Glucose-Capillary: 152 mg/dL — ABNORMAL HIGH (ref 70–99)
Glucose-Capillary: 193 mg/dL — ABNORMAL HIGH (ref 70–99)
Glucose-Capillary: 218 mg/dL — ABNORMAL HIGH (ref 70–99)
Glucose-Capillary: 197 mg/dL — ABNORMAL HIGH (ref 70–99)
Glucose-Capillary: 227 mg/dL — ABNORMAL HIGH (ref 70–99)

## 2019-04-09 MED ORDER — PRO-STAT SUGAR FREE PO LIQD
30.0000 mL | Freq: Two times a day (BID) | ORAL | Status: DC
  Administered 2019-04-09: 30 mL
  Filled 2019-04-09: qty 30

## 2019-04-09 MED ORDER — VITAL AF 1.2 CAL PO LIQD
1000.0000 mL | ORAL | Status: DC
  Administered 2019-04-09: 1000 mL

## 2019-04-09 MED ORDER — PRO-STAT SUGAR FREE PO LIQD
30.0000 mL | Freq: Every day | ORAL | Status: DC
  Administered 2019-04-10: 10:00:00 30 mL
  Filled 2019-04-09: qty 30

## 2019-04-09 MED ORDER — VITAL HIGH PROTEIN PO LIQD
1000.0000 mL | ORAL | Status: DC
  Administered 2019-04-09: 1000 mL

## 2019-04-09 NOTE — Progress Notes (Signed)
NAME:  Marc Young, MRN:  [id number], DOB:  03/06/1946, LOS: 65 ADMISSION DATE:  03/03/2019, CONSULTATION DATE: 1/1 REFERRING MD: Dr. Waldron Labs, CHIEF COMPLAINT: Acute respiratory failure  Brief History   74 yo male developed viral respiratory symptoms from 12/23.  Found to have COVID 19 pneumonia.  Treated with steroids, remdesivir, tocilizumab.  Required intubation 1/01.  Past Medical History  CKD 1, DM, HLD, HTN  Significant Hospital Events   1/1 VDRF, start nimbex 1/3 Transfer to [facility] from [facility] for evaluation of GI bleed 1/4 underwent EGD with clip placed on gastric ulcer with visible vessel; transition off nimbex 1/9 start pressure support  1/15 Intermittent desaturation ,BP  marginal and periodic episodes of bradycardia 1/17: remains very tenuous, with desaturations, tachycardia with slight movement. Distended abdomen and minimal bowel sounds despite nl axr yesterday.  1/18: Desaturating easily with reduction in sedation  Consults:  GI >> s/o 1/05  Procedures:  ETT 1/1 >>  Rt PICC 1/2 >>   Significant Diagnostic Tests:  1/4 EGD >> 2 gastric ulcers clipped.  No high risk stigmata. CT angiogram chest 1/10 no PE, bilateral upper lobe groundglass opacity and lingular consolidation, multifocal pneumonia  Micro Data:  SARS CoV2 12/30 >> positive Blood 12/30 >> negative C. difficile 12/31 >> negative Sputum 1/10 >> enterococcus BAL 1/14 >> Enterococcus  COVID Therapy:  Remdesivir 12/30 >> 1/03 Tocilizumab 12/30   Antimicrobials:  Azithromycin 12/31-1/06 Ceftriaxone 12/31-1/05 Cefepime 1/10>>> Vanc 1/10>>>1/13 Ampicillin 1/16-->  Interim history/subjective:   Remains critically ill Still with high vent requirements Abdominal distention persists, did move his bowels on 04/08/2019  Objective   Blood pressure (!) 141/68, pulse 79, temperature 97.6 F (36.4 C), temperature source Oral, resp. rate (!) 26, height _0  (1.6 m), weight 77.2 kg, SpO2 93  %.    Vent Mode: PRVC FiO2 (%):  [80 %-100 %] 80 % Set Rate:  [32 bmp] 32 bmp Vt Set:  [340 mL] 340 mL PEEP:  [14 cmH20] 14 cmH20 Plateau Pressure:  [26 cmH20-34 cmH20] 30 cmH20   Intake/Output Summary (Last 24 hours) at 04/09/2019 0905 Last data filed at 04/09/2019 0800 Gross per 24 hour  Intake 3778.6 ml  Output 1075 ml  Net 2703.6 ml   Filed Weights   04/07/19 0500 04/08/19 0429 04/09/19 0500  Weight: 75.3 kg 77.2 kg 77.2 kg   Examination:  General -acutely ill-appearing, sedated HEENT: Pupils equal and reacting, endotracheal tube in place cardiac: S1-S2 appreciated Chest: Bilateral crackles, no wheezing Abdomen: Distended, soft, bowel sounds appreciated Extremities - 1+ edema and -tenderness Skin - no rashes Neuro: Sedated, not responsive to verbal stimuli  Chest x-ray 1/18-reviewed by myself showing improvement in bilateral infiltrative process   Resolved Hospital Problem list:    Assessment & Plan:  ARDS from Covid pneumonia -Continue full vent support -Lung protective ventilation -Maintain driving pressure less than 15 -Not a proning candidate secondary to bradycardia -Not tolerating significant weaning at present -Still requiring significant sedation to keep him ventilated  Septic shock -On ampicillin full Enterococcus -Continue to follow cultures -HCAP secondary to Enterococcus  Status post GI bleed, acute blood loss anemia -Continue Protonix -Transfuse per protocol -Subcu heparin -SCDs  Abdominal distention/constipation -No significant gas per time -He was able to move his bowels yesterday -Repeat Relistor 1/20 -Will advance feeding rate to 40  Type 2 diabetes -SSI -Levemir on hold  AKI -Continue to trend -Creatinine stable -No clear indication for renal replacement therapy at present  Electrolyte derangement -Replete potassium  Metabolic encephalopathy secondary to hypoxia -RASS goal -2, having desaturations with lifting sedation  currently  History of hypertension, hyperlipidemia BP has been stable -Hydralazine as needed  I did discuss with spouse on 1/18 Unable to make a decision regarding tracheostomy, goals of care discussion ongoing We will update out today regarding his lack of response to ongoing treatment   Best practice:  DVT prophylaxis: heparin TID SUP: protonix Nutrition: Tube feeds Mobility: bed rest Goals of care: DNR, intubation ok  Disposition: ICU   The patient is critically ill with multiple organ systems failure and requires high complexity decision making for assessment and support, frequent evaluation and titration of therapies, application of advanced monitoring technologies and extensive interpretation of multiple databases. Critical Care Time devoted to patient care services described in this note independent of APP/resident time (if applicable)  is 35 minutes.   Sherrilyn Rist MD [place] [facility] Personal pager: [phone] If unanswered, please page CCM On-call: [phone]

## 2019-04-09 NOTE — Progress Notes (Signed)
Nutrition Follow-up  DOCUMENTATION CODES:   Not applicable  INTERVENTION:   Tube Feeding:  Vital AF 1.2 @ 65 ml/hr Pro-Stat 30 mL daily Provides 132 g of protein, 1972 kcals and 1263 mL of free water  TF regimen and propofol at current rate providing 2650 total kcal/day   Juven BID, each packet provides 80 calories, 8 grams of carbohydrate, 2.5  grams of protein (collagen), 7 grams of L-arginine and 7 grams of L-glutamine; supplement contains CaHMB, Vitamins C, E, B12 and Zinc to promote wound healing  NUTRITION DIAGNOSIS:   Increased nutrient needs related to acute illness(COVID-19 infection) as evidenced by estimated needs.  Being addressed via TF   GOAL:   Patient will meet greater than or equal to 90% of their needs  Progressing  MONITOR:   Vent status, Labs, Weight trends, TF tolerance, I & O's  REASON FOR ASSESSMENT:   Consult Enteral/tube feeding initiation and management  ASSESSMENT:   74 year old man with history of diabetes and hypertension, viral symptoms beginning about 12/23.  Initial outpatient Covid testing negative but then COVID-19 positive on repeat.  Scheduled for monoclonal antibody infusion 12/30 but found to be significantly hypoxemic on room air.  Admitted 12/30 for further care.  Had been developing worsening cough, body aches, subjective fever.  Chest x-ray 12/30 with extensive multifocal bilateral infiltrates.  Empiric coverage for CAP initiated.  Corticosteroids, remdesivir started, Actemra given x1.  Experienced progressive respiratory distress following admission, ultimately required intubation mechanical ventilation early 1/1.  RD working remotely.  1/01 intubated, paralyzed 1/03 Transfer to [facility] from [facility] due to GI bleed 1/04 EGD with clip placed on gastric ulcer, paralytic d/c 1/07 TF held for high residuals??? 1/08 Cortak placed 1/14 Cortrak clogged 1/15 Cortrak tube unclogged 1/16 TF held for abdominal distention, abd xray  negative 1/18 Abd xray negative, Cortrak post-pyloric  Patient is currently intubated on ventilator support, sedated on fentanyl and propofol, reqiuring levophed; no proning MV: 13.2 L/min Temp (24hrs), Avg:98 F (36.7 C), Min:97.6 F (36.4 C), Max:98.7 F (37.1 C)  Propofol: 25.7 ml/hr  TF rate increased today to 40 ml/hr, infusing via post-pyloric Cortrak  +BM  Labs: phosphorus 6.1 (H) Meds: 1/2 NS at 75 ml/hr, ascorbic acid, ss novolog, relgan, zinc sulfate  Diet Order:   Diet Order            Diet NPO time specified  Diet effective now              EDUCATION NEEDS:   No education needs have been identified at this time  Skin:  Skin Assessment: Skin Integrity Issues: Skin Integrity Issues:: Stage I Stage I: L buttocks  Last BM:  1/18  Height:   Ht Readings from Last 1 Encounters:  03/27/19 5\' 3"  (1.6 m)    Weight:   Wt Readings from Last 1 Encounters:  04/09/19 77.2 kg    Ideal Body Weight:  56.3 kg  BMI:  Body mass index is 30.15 kg/m.  Estimated Nutritional Needs:   Kcal:  1980-2310 kcals  Protein:  105-140 g  Fluid:  2L/day   04/11/19 MS, RDN, LDN, CNSC [phone] Pager  [phone] Weekend/On-Call Pager

## 2019-04-09 NOTE — Progress Notes (Signed)
Updated patient and spouse about his status  Questions answered  Unable to make a decision regarding goals of care

## 2019-04-09 NOTE — Progress Notes (Signed)
Called to see patient  Increased oxygen requirement  Auscultation reveals decreased air movement on the left side  Continue ventilator support Obtain chest x-ray  Continues to do very poorly

## 2019-04-10 DIAGNOSIS — R579 Shock, unspecified: Secondary | ICD-10-CM

## 2019-04-10 DIAGNOSIS — Z7189 Other specified counseling: Secondary | ICD-10-CM

## 2019-04-10 DIAGNOSIS — Z515 Encounter for palliative care: Secondary | ICD-10-CM

## 2019-04-10 LAB — COMPREHENSIVE METABOLIC PANEL
ALT: 36 U/L (ref 0–44)
AST: 24 U/L (ref 15–41)
Albumin: 2.7 g/dL — ABNORMAL LOW (ref 3.5–5.0)
Alkaline Phosphatase: 102 U/L (ref 38–126)
Anion gap: 15 (ref 5–15)
BUN: 91 mg/dL — ABNORMAL HIGH (ref 8–23)
CO2: 21 mmol/L — ABNORMAL LOW (ref 22–32)
Calcium: 8.3 mg/dL — ABNORMAL LOW (ref 8.9–10.3)
Chloride: 97 mmol/L — ABNORMAL LOW (ref 98–111)
Creatinine, Ser: 2.56 mg/dL — ABNORMAL HIGH (ref 0.61–1.24)
GFR calc Af Amer: 28 mL/min — ABNORMAL LOW (ref >60)
GFR calc non Af Amer: 24 mL/min — ABNORMAL LOW (ref >60)
Glucose, Bld: 276 mg/dL — ABNORMAL HIGH (ref 70–99)
Potassium: 3.7 mmol/L (ref 3.5–5.1)
Sodium: 133 mmol/L — ABNORMAL LOW (ref 135–145)
Total Bilirubin: 0.8 mg/dL (ref 0.3–1.2)
Total Protein: 5.4 g/dL — ABNORMAL LOW (ref 6.5–8.1)

## 2019-04-10 LAB — CBC
HCT: 31.2 % — ABNORMAL LOW (ref 39.0–52.0)
Hemoglobin: 9.7 g/dL — ABNORMAL LOW (ref 13.0–17.0)
MCH: 31.2 pg (ref 26.0–34.0)
MCHC: 31.1 g/dL (ref 30.0–36.0)
MCV: 100.3 fL — ABNORMAL HIGH (ref 80.0–100.0)
Platelets: 200 10*3/uL (ref 150–400)
RBC: 3.11 MIL/uL — ABNORMAL LOW (ref 4.22–5.81)
RDW: 15.9 % — ABNORMAL HIGH (ref 11.5–15.5)
WBC: 7.4 10*3/uL (ref 4.0–10.5)
nRBC: 0 % (ref 0.0–0.2)

## 2019-04-10 LAB — PHOSPHORUS: Phosphorus: 6.2 mg/dL — ABNORMAL HIGH (ref 2.5–4.6)

## 2019-04-10 LAB — GLUCOSE, CAPILLARY
Glucose-Capillary: 237 mg/dL — ABNORMAL HIGH (ref 70–99)
Glucose-Capillary: 258 mg/dL — ABNORMAL HIGH (ref 70–99)
Glucose-Capillary: 380 mg/dL — ABNORMAL HIGH (ref 70–99)

## 2019-04-10 LAB — TRIGLYCERIDES
Triglycerides: 920 mg/dL — ABNORMAL HIGH (ref <150)
Triglycerides: 311 mg/dL — ABNORMAL HIGH (ref <150)

## 2019-04-10 LAB — MAGNESIUM: Magnesium: 2.3 mg/dL (ref 1.7–2.4)

## 2019-04-10 MED ORDER — CHLORHEXIDINE GLUCONATE CLOTH 2 % EX PADS
6.0000 | MEDICATED_PAD | Freq: Every day | CUTANEOUS | Status: DC
  Administered 2019-04-10: 6 via TOPICAL

## 2019-04-10 MED ORDER — METHYLNALTREXONE BROMIDE 12 MG/0.6ML ~~LOC~~ SOLN
12.0000 mg | Freq: Once | SUBCUTANEOUS | Status: AC
  Administered 2019-04-10: 12 mg via SUBCUTANEOUS
  Filled 2019-04-10: qty 0.6

## 2019-04-10 MED ORDER — INSULIN ASPART 100 UNIT/ML ~~LOC~~ SOLN
3.0000 [IU] | SUBCUTANEOUS | Status: DC
  Administered 2019-04-10 (×2): 3 [IU] via SUBCUTANEOUS

## 2019-04-11 ENCOUNTER — Telehealth: Payer: Self-pay

## 2019-04-11 NOTE — Telephone Encounter (Signed)
This note was entered by mistake. °

## 2019-04-11 NOTE — Telephone Encounter (Signed)
Receive a phone call from [facility].  I told the lady on the phone that the dc will be signed by Doctor Molli Knock. I gave her Pulmonary address so she could mail or bring the dc to the office.

## 2019-04-15 MED FILL — Fentanyl Citrate Preservative Free (PF) Inj 2500 MCG/50ML: INTRAMUSCULAR | Qty: 50 | Status: AC

## 2019-04-15 MED FILL — Sodium Chloride IV Soln 0.9%: INTRAVENOUS | Qty: 250 | Status: AC

## 2019-04-22 NOTE — Death Summary Note (Signed)
DEATH SUMMARY   Patient Details  Name: Marc Young MRN: [id number] DOB: Jun 26, 1945  Admission/Discharge Information   Admit Date:  03-22-19  Date of Death:    Time of Death:    Length of Stay: 05/13/22  Referring Physician: Maris Berger, MD   Reason(s) for Hospitalization  COVID-19 Acute Hypoxemic Respiratory Failure  Diagnoses  Preliminary cause of death:   COVID-19 Acute Hypoxemic Respiratory Failure Secondary Diagnoses (including complications and co-morbidities):  Active Problems:   COVID-19   Pressure injury of skin Acute metabolic encephalopathy Acute renal failure Septic shock Hypernatremia Fluid overload Palliative encounter Goals of care discussion  Brief Hospital Course (including significant findings, care, treatment, and services provided and events leading to death)  74 yo male developed viral respiratory symptoms from 12/23.  Found to have COVID 19 pneumonia.  Treated with steroids, remdesivir, tocilizumab.  Required intubation 1/01.  1/1 VDRF, start nimbex 1/3 Transfer to [facility] from [facility] for evaluation of GI bleed 1/4 underwent EGD with clip placed on gastric ulcer with visible vessel; transition off nimbex 1/9 start pressure support  1/15 Intermittent desaturation ,BP  marginal and periodic episodes of bradycardia 1/17: remains very tenuous, with desaturations, tachycardia with slight movement. Distended abdomen and minimal bowel sounds despite nl axr yesterday.  1/18: Desaturating easily with reduction in sedation 1/20: profound hypotension and desaturation that is refractory to maximal interventions  Spoke with wife, was very direct that the patient is not improving and that we do not expect him too.  His BP is very low this AM and sats are in the 60's.  He is not safe to trach and with severe hemodynamic compromise I do not think dialysis with volume negative is a good option.  I was very clear with her that I do not believe patient will  survive and we need to discuss comfort but she is not ready.  Called by bedside RN, patient is desaturating to 65 on 100% and PEEP of 14 and SBP is 70 mmHg maxed on levophed and vasopressin.  Increased PEEP to 18 but BP dropped dramatically.  Was able to increase to 16 and observed, seems to be the most appropriate at this time.  Increase Leveophed to above max but no response in BP.  It is becoming obvious that the patient will not survive on max support.  I called Mrs Cusic back and informed her of the events of the day and that the patient is unlikely to survive today and recommended (as per our previous conversations) that if she desires to see the patient I would recommend her come in ASAP.  I checked the visitation policy in EOL conditions and informed her that she can have 4 visitors, 2 in at a time for 15 minutes.  She is understandably very upset.  All questions answered.  I informed her that there is nothing else that we can add at this point and she expressed understanding of that.  Wife arrived to visit and despite maximal support continued to deteriorate and eventually lost heart beat and since LCB patient was declared and family informed.  Pertinent Labs and Studies  Significant Diagnostic Studies DG Abd 1 View  Result Date: 04/06/2019 CLINICAL DATA:  Ileus. EXAM: ABDOMEN - 1 VIEW COMPARISON:  March 28, 2019. FINDINGS: The bowel gas pattern is normal. Distal tip of nasogastric tube is seen in expected position of distal stomach. Distal tip of feeding tube is seen in expected position of second portion of duodenum. No radio-opaque  calculi or other significant radiographic abnormality are seen. IMPRESSION: No evidence of bowel obstruction or ileus. Distal tip of feeding tube is seen in expected position of second portion of duodenum. Electronically Signed   By: Marijo Conception M.D.   On: 04/06/2019 12:09   DG Abd 1 View  Result Date: 03/28/2019 CLINICAL DATA:  OG tube placement. EXAM:  ABDOMEN - 1 VIEW COMPARISON:  None. FINDINGS: OG tube is in place with both the tip and side-port in the stomach. IMPRESSION: OG tube in good position. Electronically Signed   By: Inge Rise M.D.   On: 03/28/2019 11:11   CT ANGIO CHEST PE W OR WO CONTRAST  Result Date: 03/31/2019 CLINICAL DATA:  COVID positive. Hypoxia. EXAM: CT ANGIOGRAPHY CHEST WITH CONTRAST TECHNIQUE: Multidetector CT imaging of the chest was performed using the standard protocol during bolus administration of intravenous contrast. Multiplanar CT image reconstructions and MIPs were obtained to evaluate the vascular anatomy. CONTRAST:  69m OMNIPAQUE IOHEXOL 350 MG/ML SOLN COMPARISON:  None. FINDINGS: Cardiovascular: Contrast injection is sufficient to demonstrate satisfactory opacification of the pulmonary arteries to the segmental level. There is no pulmonary embolus or evidence of right heart strain. The size of the main pulmonary artery is normal. Heart size is normal, with no pericardial effusion. The course and caliber of the aorta are normal. There is mild atherosclerotic calcification. No acute aortic syndrome. Mediastinum/Nodes: No mediastinal, hilar or axillary lymphadenopathy. The visualized thyroid and thoracic esophageal course are unremarkable. Lungs/Pleura: Endotracheal tube tip is at the level of the clavicular heads. There is lingula and left lower lobe consolidation with bilateral upper lobe predominant ground-glass opacities. Small pleural effusions. Upper Abdomen: Contrast bolus timing is not optimized for evaluation of the abdominal organs. The visualized portions of the organs of the upper abdomen are normal. Musculoskeletal: Diffuse thoracic ankylosis. Review of the MIP images confirms the above findings. IMPRESSION: 1. No pulmonary embolus or acute aortic syndrome. 2. Bilateral upper lobe predominant ground-glass opacities and left lower lobe/lingula consolidation, consistent with multifocal pneumonia. 3. Aortic  Atherosclerosis (ICD10-I70.0). Electronically Signed   By: KUlyses JarredM.D.   On: 03/31/2019 22:37   DG CHEST PORT 1 VIEW  Result Date: 04/09/2019 CLINICAL DATA:  Respiratory failure EXAM: PORTABLE CHEST 1 VIEW COMPARISON:  04/07/2019 FINDINGS: Endotracheal tube, partially imaged two enteric tubes, and right PICC are unchanged. Persistent bilateral pulmonary opacities. Lung aeration is slightly worse. Stable cardiomediastinal contours. Possible trace pleural effusions. No pneumothorax. IMPRESSION: Stable lines and tubes. Persistent bilateral pulmonary opacities with slight worsening of aeration. Electronically Signed   By: PMacy MisM.D.   On: 04/09/2019 13:26   DG CHEST PORT 1 VIEW  Result Date: 04/08/2019 CLINICAL DATA:  COVID-19 positive.  Hypoxemia EXAM: PORTABLE CHEST 1 VIEW COMPARISON:  04/07/2019 FINDINGS: Endotracheal tube in good position. Feeding tube in the stomach with the tip not visualized. NG tube also in the stomach. Right arm PICC tip in the SVC at the cavoatrial junction unchanged. Diffuse bilateral airspace disease bilaterally with mild improvement. No significant effusion. IMPRESSION: Support lines remain in good position Diffuse bilateral airspace disease with mild interval improvement. Electronically Signed   By: CFranchot GalloM.D.   On: 04/08/2019 08:14   DG Chest Port 1 View  Result Date: 04/07/2019 CLINICAL DATA:  Hypoxia, COVID-19. EXAM: PORTABLE CHEST 1 VIEW COMPARISON:  April 06, 2019. FINDINGS: Stable cardiomediastinal silhouette. Endotracheal, feeding and nasogastric tubes are unchanged in position. Right-sided PICC line is unchanged. No pneumothorax or pleural effusion  is noted. Stable bilateral patchy airspace opacities are noted consistent multifocal pneumonia. Bony thorax is unremarkable. IMPRESSION: Stable support apparatus. Stable bilateral lung opacities consistent with multifocal pneumonia. Electronically Signed   By: Marijo Conception M.D.   On: 04/07/2019  08:59   DG Chest Port 1 View  Result Date: 04/06/2019 CLINICAL DATA:  Endotracheal tube positioning. COVID-19 pneumonia. EXAM: PORTABLE CHEST 1 VIEW COMPARISON:  One-view chest x-ray 04/05/2019 FINDINGS: Heart size is enlarged. Endotracheal tube is stable and in satisfactory position. Right PICC line terminates at the cavoatrial junction. Feeding tube and NG tube are in place, coursing of the inferior border of the film. Increasing interstitial and airspace disease is present bilaterally. Bilateral pleural effusions are present. IMPRESSION: 1. Increasing interstitial and airspace disease bilaterally compatible with multi lobar pneumonia. Superimposed edema is not excluded. 2. Support apparatus is stable and in satisfactory position. Electronically Signed   By: San Morelle M.D.   On: 04/06/2019 06:46   DG Chest Port 1 View  Result Date: 04/05/2019 CLINICAL DATA:  Intubation EXAM: PORTABLE CHEST 1 VIEW COMPARISON:  Two days ago FINDINGS: Endotracheal tube tip at the clavicular heads. Feeding tube which at least reaches the stomach. Right PICC with tip at the upper cavoatrial junction. Dense bilateral airspace disease. There could be trace left pleural effusion. No pneumothorax. Stable heart size. IMPRESSION: Stable hardware positioning and dense bilateral pneumonia. Electronically Signed   By: Monte Fantasia M.D.   On: 04/05/2019 07:08   DG CHEST PORT 1 VIEW  Result Date: 04/04/2019 CLINICAL DATA:  74 year old male positive COVID-19. Tachycardia and hypoxia. EXAM: PORTABLE CHEST 1 VIEW COMPARISON:  0514 hours the same day. FINDINGS: Portable AP semi upright view at 2353 hours. Endotracheal tube tip in good position at the level the clavicles. Enteric tube courses to the abdomen, tip not included. Stable right PICC line. Less rotated to the left. Stable lung volumes. Patchy and confluent bilateral mid and lower lung opacity with dense retrocardiac opacity and some air bronchograms. Patchy right  mid lung opacity appears increased from earlier. Stable ventilation elsewhere. No pneumothorax. Stable cardiac size and mediastinal contours. Paucity of bowel gas. IMPRESSION: 1. Bilateral COVID-19 pneumonia with increased patchy right mid lung opacity since earlier today. 2. Stable lines and tubes. Electronically Signed   By: Genevie Ann M.D.   On: 04/04/2019 00:21   DG Chest Port 1 View  Result Date: 04/03/2019 CLINICAL DATA:  COVID-19 positive. EXAM: PORTABLE CHEST 1 VIEW COMPARISON:  April 02, 2019. FINDINGS: Stable cardiomegaly. Endotracheal and feeding tubes are unchanged in position. No pneumothorax is noted. Stable bilateral patchy airspace opacities are noted consistent with multifocal pneumonia. Small bilateral pleural effusions are noted. Bony thorax is unremarkable. IMPRESSION: Stable support apparatus. Stable bilateral patchy airspace opacities are noted consistent with multifocal pneumonia. Small bilateral pleural effusions are noted. Electronically Signed   By: Marijo Conception M.D.   On: 04/03/2019 07:40   DG Chest Port 1 View  Result Date: 04/02/2019 CLINICAL DATA:  Intubation. EXAM: PORTABLE CHEST 1 VIEW COMPARISON:  CT 03/31/2019.  Chest x-ray 03/31/2019. FINDINGS: Endotracheal tube, feeding tube, right PICC line in unchanged position. PICC line tip noted over the lower right atrium. Heart size stable. Diffuse bilateral pulmonary infiltrates/edema and again noted with interim worsening from prior exam. Small bilateral pleural effusions. No pneumothorax. Thoracic spine scoliosis and degenerative change. Old left rib fracture. IMPRESSION: 1. Lines and tubes in unchanged position. PICC line tip noted over the right atrium. 2. Diffuse bilateral  pulmonary infiltrates/edema with interim progression from prior exam. Small bilateral pleural effusions noted. Electronically Signed   By: Marcello Moores  Register   On: 04/02/2019 06:37   DG Chest Port 1 View  Result Date: 04/01/2019 CLINICAL DATA:   Respiratory failure EXAM: PORTABLE CHEST 1 VIEW COMPARISON:  04/01/2019 FINDINGS: Patchy bilateral airspace disease. Moderate left pleural effusion. No significant change since prior study. Support devices are stable. IMPRESSION: Patchy bilateral airspace disease. Moderate left effusion. No change since prior study. Electronically Signed   By: Rolm Baptise M.D.   On: 04/01/2019 18:49   DG Chest Port 1 View  Result Date: 04/01/2019 CLINICAL DATA:  COVID positive hypoxia EXAM: PORTABLE CHEST 1 VIEW COMPARISON:  CT chest March 31, 2019 FINDINGS: The cardiomediastinal silhouette is partially obscured. Again noted is patchy airspace consolidation within the left lower lung with air bronchograms. There is patchy/hazy ground-glass opacity seen throughout the right lung. ET tube is 1.8 cm above the level of the carina. Enteric tube is seen below the diaphragm. A right-sided PICC catheter seen with the tip at the superior cavoatrial junction. No acute osseous abnormality. IMPRESSION: No significant change in the bilateral airspace opacity/consolidation, left worse than right, consistent with multifocal pneumonia. Electronically Signed   By: Prudencio Pair M.D.   On: 04/01/2019 01:16   DG CHEST PORT 1 VIEW  Result Date: 03/31/2019 CLINICAL DATA:  Respiratory failure, hypoxia EXAM: PORTABLE CHEST 1 VIEW COMPARISON:  03/31/2019 FINDINGS: Support devices are stable. Extensive bilateral airspace disease again noted, increasing in the left lower lobe since prior study, similar on the right. No visible effusions or pneumothorax. No acute bony abnormality. Multiple old rib fractures. IMPRESSION: Extensive bilateral airspace disease, worsening in the left lower lobe since prior study. Electronically Signed   By: Rolm Baptise M.D.   On: 03/31/2019 21:47   DG Chest Port 1 View  Result Date: 03/31/2019 CLINICAL DATA:  Respiratory failure.  COVID. EXAM: PORTABLE CHEST 1 VIEW COMPARISON:  03/30/2019 FINDINGS: 0335 hours.  Endotracheal tube tip is 3 cm above the base of the carina right PICC line tip overlies the expected location of the right atrium. Low lung volumes with patchy bilateral airspace opacities, progressive in the right mid lung and left base compared to prior study. Cardiopericardial silhouette upper limits of normal to increased in size. A feeding tube passes into the stomach although the distal tip position is not included on the film. The visualized bony structures of the thorax are intact. Telemetry leads overlie the chest. IMPRESSION: Interval progression of patchy bilateral airspace disease. Electronically Signed   By: Misty Stanley M.D.   On: 03/31/2019 04:34   DG Chest Port 1 View  Result Date: 03/30/2019 CLINICAL DATA:  Respiratory failure EXAM: PORTABLE CHEST 1 VIEW COMPARISON:  March 26, 2018 FINDINGS: The heart size and mediastinal contours are unchanged. A right-sided PICC catheter seen with the tip at the superior cavoatrial junction. ETT is 3 cm above the carina. Enteric tube is seen below the diaphragm. There is improvement in aeration and the appearance of patchy airspace opacities seen within both lower lungs. There is a trace left pleural effusion. No acute osseous abnormality. IMPRESSION: Interval improvement in aeration and patchy airspace opacities within both lungs. Trace left pleural effusion. Stable lines and tubes. Electronically Signed   By: Prudencio Pair M.D.   On: 03/30/2019 02:32   DG Chest Port 1 View  Result Date: 03/27/2019 CLINICAL DATA:  Respiratory failure.  COVID-19 EXAM: PORTABLE CHEST 1 VIEW COMPARISON:  Yesterday FINDINGS: Endotracheal tube tip is at the clavicular heads. The enteric tube reaches the stomach at least. Right PICC with tip at the upper right atrium. Low volume chest with extensive bilateral airspace disease. There may also be layering pleural fluid. No visible pneumothorax. Cardiomegaly distorted by rotation. IMPRESSION: 1. Unremarkable hardware positioning.  2. Stable or worsened bilateral airspace disease with probable layering pleural fluid. Electronically Signed   By: Monte Fantasia M.D.   On: 03/27/2019 06:04   DG Chest Port 1 View  Result Date: 03/26/2019 CLINICAL DATA:  Acute respiratory failure from COVID EXAM: PORTABLE CHEST 1 VIEW COMPARISON:  Two days ago FINDINGS: Endotracheal tube with tip at the clavicular heads. Right PICC with tip at the upper cavoatrial junction. Enteric tube which at least reaches the stomach. Bilateral airspace disease with pleural effusions, worsened. No visible pneumothorax. IMPRESSION: 1. Unremarkable hardware positioning. 2. Pneumonia and small pleural effusions with worsened lower lobe aeration compared to yesterday. Electronically Signed   By: Monte Fantasia M.D.   On: 03/26/2019 06:40   DG CHEST PORT 1 VIEW  Result Date: 03/24/2019 CLINICAL DATA:  COVID-19 positivity, follow-up exam EXAM: PORTABLE CHEST 1 VIEW COMPARISON:  03/24/2019 FINDINGS: Cardiac shadow is stable. Endotracheal tube, nasogastric catheter and right-sided PICC line are again noted. The endotracheal tube is been withdrawn somewhat and now lies 7.2 cm above the carina. PICC line tip is again noted in the mid right atrium. Patchy opacities are again seen bilaterally consistent with the patient's given clinical history of COVID-19 positivity. IMPRESSION: Stable opacities consistent with the given clinical history. Tubes and lines as described. Electronically Signed   By: Inez Catalina M.D.   On: 03/24/2019 17:26   DG Chest Port 1 View  Result Date: 03/24/2019 CLINICAL DATA:  Acute respiratory failure with hypoxia. EXAM: PORTABLE CHEST 1 VIEW COMPARISON:  Radiograph yesterday. FINDINGS: Endotracheal tube tip 2.9 cm from the carina. Enteric tube tip below the diaphragm not included in the field of view. Upper extremity PICC tip remains in place. Worsening airspace disease throughout both lungs with hazy opacity at the bases suspicious for developing  pleural effusions. No pneumothorax. IMPRESSION: 1. Worsening airspace disease throughout both lungs. Increasing hazy opacity at the bases suspicious for developing pleural effusions. 2. Stable support apparatus. Electronically Signed   By: Keith Rake M.D.   On: 03/24/2019 06:09   DG Chest Port 1 View  Result Date: 03/23/2019 CLINICAL DATA:  Acute respiratory failure with hypoxia. EXAM: PORTABLE CHEST 1 VIEW COMPARISON:  08/20/2019 FINDINGS: The endotracheal tube tip is above the carina. Nasogastric tube the tip is well below the level of the GE junction but not included on the field of view. Normal heart size. Diffuse interstitial opacities in the right lung and left lower lung are again noted. When compared with previous exam there is been improved aeration to the right midlung and left base. IMPRESSION: 1. Stable support apparatus. 2. Persistent interstitial opacities in the right lung and left lower lung with improved aeration to the right midlung and left base. Electronically Signed   By: Kerby Moors M.D.   On: 03/23/2019 08:49   DG CHEST PORT 1 VIEW  Result Date: 03/22/2019 CLINICAL DATA:  COVID-19. EXAM: PORTABLE CHEST 1 VIEW COMPARISON:  Earlier today at 38 hours. FINDINGS: 1215 hours. Endotracheal tube terminates 4.4 cm above carina. Nasogastric tube terminates at the body of the stomach. Normal heart size. Patient rotated right. No pleural effusion or pneumothorax. Lower lobe predominant interstitial and airspace  opacities are similar in severity and distribution. IMPRESSION: Similar interstitial and airspace opacities, consistent with bilateral COVID-19 pneumonia. Appropriate position of endotracheal tube. Electronically Signed   By: Abigail Miyamoto M.D.   On: 03/22/2019 12:34   DG Chest Port 1 View  Result Date: 03/22/2019 CLINICAL DATA:  Acute respiratory failure with hypoxia. EXAM: PORTABLE CHEST 1 VIEW COMPARISON:  03/22/2019 FINDINGS: Jaw and surrounding soft tissues overlie the  mediastinum and trachea. Cannot confirm location of tracheostomy tube tip. NG tube tip is below the field of view. Normal heart size. Bilateral pulmonary opacities are again noted. When compared with the previous exam aeration of both lungs is slightly improved. No new findings identified. IMPRESSION: 1. Persistent bilateral interstitial and airspace opacities. Slightly improved in the interval. 2. Cannot confirm location of tracheostomy tube tip due to overlying soft tissue and osseous structures. Electronically Signed   By: Kerby Moors M.D.   On: 03/22/2019 10:26   DG CHEST PORT 1 VIEW  Result Date: 03/22/2019 CLINICAL DATA:  Hypoxia. Intubation. EXAM: PORTABLE CHEST 1 VIEW COMPARISON:  Radiograph 02/25/2019 FINDINGS: Endotracheal tube tip 3.3 cm from the carina. Low lung volumes persist. Diffuse bilateral heterogeneous airspace opacities, progressed from prior exam, particularly in the left lung. Cardiomediastinal contours are grossly unchanged, partially obscured by bilateral lung opacities. Possible pleural effusions. No evidence of pneumothorax. IMPRESSION: 1. Endotracheal tube tip 3.3 cm from the carina. 2. Progressive diffuse bilateral heterogeneous airspace opacities over the past 2 days, particularly in the left lung. Findings consistent with pneumonia, an element of edema or ARDS also considered. 3. Possible pleural effusions. Electronically Signed   By: Keith Rake M.D.   On: 03/22/2019 01:13   DG Chest Port 1 View  Result Date: 03/16/2019 CLINICAL DATA:  Shortness of breath.  COVID-19 positive EXAM: PORTABLE CHEST 1 VIEW COMPARISON:  None. FINDINGS: There is extensive airspace opacity throughout the right mid lung and both basilar regions. Heart is borderline enlarged with pulmonary vascularity normal. No adenopathy evident. No bone lesions. IMPRESSION: Extensive multifocal pneumonia, more severe on the right than on the left. Borderline cardiomegaly. No adenopathy demonstrable.  Electronically Signed   By: Lowella Grip III M.D.   On: 03/21/2019 11:28   DG Abd Portable 1V  Result Date: 04/08/2019 CLINICAL DATA:  Distended abdomen EXAM: PORTABLE ABDOMEN - 1 VIEW COMPARISON:  04/05/2018 FINDINGS: Limited evaluation due to technique and large patient size. Nearly gasless abdomen. No ileus or obstruction NG tube in the body of the stomach. Feeding tube in the second portion duodenum. IMPRESSION: Nearly gasless abdomen.  No dilated loops. Feeding tube in the second portion the duodenum. NG tube in the body the stomach. Electronically Signed   By: Franchot Gallo M.D.   On: 04/08/2019 08:21   DG Abd Portable 1V  Result Date: 03/21/2019 CLINICAL DATA:  Abdominal distension. EXAM: PORTABLE ABDOMEN - 1 VIEW COMPARISON:  None. FINDINGS: Evaluation is limited by patient body habitus. The bowel gas pattern is nonspecific and nonobstructive. There is mild gaseous distention of the stomach. Extensive bilateral airspace opacities are noted at the lung bases. IMPRESSION: Nonobstructive and nonspecific bowel gas pattern. Electronically Signed   By: Constance Holster M.D.   On: 03/21/2019 15:01   [place] EKG SITE RITE  Result Date: 03/22/2019 If Site Rite image not attached, placement could not be confirmed due to current cardiac rhythm.   Microbiology Recent Results (from the past 240 hour(s))  Culture, bal-quantitative     Status: None   Collection Time:  04/04/19  3:02 PM   Specimen: Bronchoalveolar Lavage; Tracheal  Result Value Ref Range Status   Specimen Description BRONCHIAL ALVEOLAR LAVAGE  Final   Special Requests Immunocompromised  Final   Gram Stain   Final    ABUNDANT WBC PRESENT, PREDOMINANTLY PMN FEW GRAM POSITIVE COCCI Performed at [facility], [address] [place], [place] [address]    Culture MODERATE ENTEROCOCCUS FAECALIS  Final   Report Status 04/07/2019 FINAL  Final   Organism ID, Bacteria ENTEROCOCCUS FAECALIS  Final      Susceptibility    Enterococcus faecalis - MIC*    AMPICILLIN 8 SENSITIVE Sensitive     VANCOMYCIN 2 SENSITIVE Sensitive     GENTAMICIN SYNERGY SENSITIVE Sensitive     * MODERATE ENTEROCOCCUS FAECALIS    Lab Basic Metabolic Panel: Recent Labs  Lab 04/06/19 0506 04/06/19 0506 04/07/19 0517 04/07/19 7001 04/08/19 0124 04/08/19 0521 04/08/19 0845 04/09/19 0307 04/09/19 1008 04/09/19 1729 2019-04-29 0444  NA 139   < > 139   < > 137 157* 141 138  --   --  133*  K 3.1*   < > 3.0*   < > 3.8 3.4* 3.8 3.6  --   --  3.7  CL 98   < > 98  --   --  86* 101 100  --   --  97*  CO2 28   < > 26  --   --  19* 24 24  --   --  21*  GLUCOSE 254*   < > 131*  --   --  137* 145* 149*  --   --  276*  BUN 60*   < > 62*  --   --  64* 69* 82*  --   --  91*  CREATININE 1.90*   < > 2.28*  --   --  2.56* 2.91* 2.74*  --   --  2.56*  CALCIUM 8.9   < > 9.2  --   --  5.9* 9.2 8.7*  --   --  8.3*  MG 2.1  --  2.3  --   --   --   --   --  2.5* 2.4 2.3  PHOS 3.9  --  4.3  --   --   --   --   --  6.1* 6.5* 6.2*   < > = values in this interval not displayed.   Liver Function Tests: Recent Labs  Lab 04/08/19 0521 04-29-19 0444  AST 16 24  ALT 25 36  ALKPHOS 65 102  BILITOT 1.0 0.8  PROT 4.9* 5.4*  ALBUMIN 2.5* 2.7*   No results for input(s): LIPASE, AMYLASE in the last 168 hours. No results for input(s): AMMONIA in the last 168 hours. CBC: Recent Labs  Lab 04/06/19 0506 04/06/19 0506 04/07/19 0517 04/07/19 0517 04/07/19 0938 04/08/19 0124 04/08/19 0521 04/09/19 0307 29-Apr-2019 0444  WBC 4.0  --  5.0  --   --   --  6.8 7.1 7.4  HGB 9.0*   < > 9.6*   < > 9.2* 9.5* 9.6* 9.2* 9.7*  HCT 28.6*   < > 30.9*   < > 27.0* 28.0* 31.2* 29.4* 31.2*  MCV 96.3  --  96.6  --   --   --  97.8 97.7 100.3*  PLT 127*  --  166  --   --   --  244 183 200   < > = values in this  interval not displayed.   Cardiac Enzymes: No results for input(s): CKTOTAL, CKMB, CKMBINDEX, TROPONINI in the last 168 hours. Sepsis Labs: Recent Labs  Lab  04/07/19 0517 04/08/19 0521 04/09/19 0307 2019-04-28 0444  WBC 5.0 6.8 7.1 7.4    Procedures/Operations     Jennet Maduro 04/28/2019, 4:04 PM

## 2019-04-22 NOTE — Progress Notes (Signed)
Assisted tele visit to patient with wife.  Dorcas Melito M, RN  

## 2019-04-22 NOTE — Progress Notes (Signed)
Spoke with spouse Darral Rishel [phone]) in reference to items in the safe. States that she will pick up on Saturday (04/20/19)

## 2019-04-22 NOTE — Progress Notes (Addendum)
Called by bedside RN, patient is desaturating to 65 on 100% and PEEP of 14 and SBP is 70 mmHg maxed on levophed and vasopressin.  Increased PEEP to 18 but BP dropped dramatically.  Was able to increase to 16 and observed, seems to be the most appropriate at this time.  Increase Leveophed to above max but no response in BP.  It is becoming obvious that the patient will not survive on max support.  I called Mrs Daugherty back and informed her of the events of the day and that the patient is unlikely to survive today and recommended (as per our previous conversations) that if she desires to see the patient I would recommend her come in ASAP.  I checked the visitation policy in EOL conditions and informed her that she can have 4 visitors, 2 in at a time for 15 minutes.  She is understandably very upset.  All questions answered.  I informed her that there is nothing else that we can add at this point and she expressed understanding of that.  The patient is critically ill with multiple organ systems failure and requires high complexity decision making for assessment and support, frequent evaluation and titration of therapies, application of advanced monitoring technologies and extensive interpretation of multiple databases.   Critical Care Time devoted to patient care services described in this note is  50  Minutes. This time reflects time of care of this signee Dr Koren Bound. This critical care time does not reflect procedure time, or teaching time or supervisory time of PA/NP/Med student/Med Resident etc but could involve care discussion time.  Alyson Reedy, M.D. [facility] Pulmonary/Critical Care Medicine.

## 2019-04-22 NOTE — Progress Notes (Signed)
eLink Physician-Brief Progress Note Patient Name: Marc Young DOB: [birth year] MRN: [id number]   Date of Service  04/17/2019  HPI/Events of Note  Pt desaturated earlier but is currently back  To his baseline saturation of the low 90's. No indication for intervention at this time.  eICU Interventions  None.        Thomasene Lot Lynnea Vandervoort 04/08/2019, 5:09 AM

## 2019-04-22 NOTE — Progress Notes (Signed)
NAME:  Marc Young, MRN:  [id number], DOB:  [birth year], LOS: 21 ADMISSION DATE:  02/20/2019, CONSULTATION DATE: 1/1 REFERRING MD: Dr. Waldron Labs, CHIEF COMPLAINT: Acute respiratory failure  Brief History   74 yo male developed viral respiratory symptoms from 12/23.  Found to have COVID 19 pneumonia.  Treated with steroids, remdesivir, tocilizumab.  Required intubation 1/01.  Past Medical History  CKD 1, DM, HLD, HTN  Significant Hospital Events   1/1 VDRF, start nimbex 1/3 Transfer to [facility] from [facility] for evaluation of GI bleed 1/4 underwent EGD with clip placed on gastric ulcer with visible vessel; transition off nimbex 1/9 start pressure support  1/15 Intermittent desaturation ,BP  marginal and periodic episodes of bradycardia 1/17: remains very tenuous, with desaturations, tachycardia with slight movement. Distended abdomen and minimal bowel sounds despite nl axr yesterday.  1/18: Desaturating easily with reduction in sedation  Consults:  GI >> s/o 1/05  Procedures:  ETT 1/1 >>  Rt PICC 1/2 >>   Significant Diagnostic Tests:  1/4 EGD >> 2 gastric ulcers clipped.  No high risk stigmata. CT angiogram chest 1/10 no PE, bilateral upper lobe groundglass opacity and lingular consolidation, multifocal pneumonia  Micro Data:  SARS CoV2 12/30 >> positive Blood 12/30 >> negative C. difficile 12/31 >> negative Sputum 1/10 >> enterococcus BAL 1/14 >> Enterococcus  COVID Therapy:  Remdesivir 12/30 >> 1/03 Tocilizumab 12/30   Antimicrobials:  Azithromycin 12/31-1/06 Ceftriaxone 12/31-1/05 Cefepime 1/10>>>off Vanc 1/10>>>1/13 Ampicillin 1/16-->  Interim history/subjective:   Intermittent desaturation overnight Continues to require high FiO2 and PEEP (100 and 14 respectively)  Objective   Blood pressure (!) 161/67, pulse 99, temperature 97.8 F (36.6 C), temperature source Oral, resp. rate (!) 27, height _0  (1.6 m), weight 77.4 kg, SpO2 (!) 87 %.    Vent  Mode: PRVC FiO2 (%):  [80 %-100 %] 100 % Set Rate:  [32 bmp] 32 bmp Vt Set:  [340 mL] 340 mL PEEP:  [14 cmH20] 14 cmH20 Plateau Pressure:  [30 cmH20-37 cmH20] 35 cmH20   Intake/Output Summary (Last 24 hours) at 2019-04-26 0732 Last data filed at 04-26-19 0700 Gross per 24 hour  Intake 6144.41 ml  Output 1000 ml  Net 5144.41 ml   Filed Weights   04/08/19 0429 04/09/19 0500 04/26/2019 0216  Weight: 77.2 kg 77.2 kg 77.4 kg   Examination:  General - Acutely ill appearing male, sedate HEENT: [place]/AT, PERRL, EOM-I and MMM, ETT in place cardiac: RRR, Nl S1/S2 and -M/R/G Chest: Diminished diffusely Abdomen: NT, distended, soft and hypoactive BS Extremities - Diffuse edema, -tenderness Skin - no rashes Neuro: Sedated, not responsive to verbal stimuli  I reviewed CXR myself, ETT is in a good position, worsening infiltrate noted  Discussed with bedside RN and RT  Resolved Hospital Problem list:    Assessment & Plan:  ARDS from Covid pneumonia - Continue full vent support - Not safe for tracheostomy given high FiO2 and PEEP demand, today is day 19 on the vent - Lung protective ventilation - Maintain driving pressure less than 15 - No further proning given bradycardia every time that is attempted - Hold weaning given above - Still requiring significant sedation to keep him ventilated - Wife unable to make decision but patient is not a trach candidate at this time due to above - Would like to diurese but hemodynamics prohibit  Septic shock - Continue ampicillin for enterococcus  - Continue to follow cultures - HCAP secondary to Enterococcus - Levophed for BP support -  KVO IVF  Status post GI bleed, acute blood loss anemia - Continue Protonix - Transfusion per protocol - SQ heparin - SCDs  Abdominal distention/constipation - No significant gas per time - ?Mu receptor blocker PO for constipation with patient on high dose narcotics - Relistor vs Alvimopan, will consult with  pharmacy - Will continue TF at goal  Type 2 diabetes - SSI - Continue to hold levemir given on and off N/V and TF being held  AKI - Continue to trend - Creatinine stable - Will need significant volume off if to consider any FiO2 and PEEP weaning but not sure if family wants dialysis  Electrolyte derangement - Replete electrolytes as indicated - BMET in AM  Metabolic encephalopathy secondary to hypoxia - RASS goal -2, having desaturations with lifting sedation currently  History of hypertension, hyperlipidemia BP has been stable - Hold anti-HTN given levophed need with sedation and septic shock, was on hydralazine at home  Spoke with wife, was very direct that the patient is not improving and that we do not expect him too.  His BP is very low this AM and sats are in the 60's.  He is not safe to trach and with severe hemodynamic compromise I do not think dialysis with volume negative is a good option.  I was very clear with her that I do not believe patient will survive and we need to discuss comfort but she is not ready.  Best practice:  DVT prophylaxis: heparin TID SUP: protonix Nutrition: Tube feeds Mobility: bed rest Goals of care: DNR, intubation ok  Disposition: ICU   The patient is critically ill with multiple organ systems failure and requires high complexity decision making for assessment and support, frequent evaluation and titration of therapies, application of advanced monitoring technologies and extensive interpretation of multiple databases.   Critical Care Time devoted to patient care services described in this note is  40  Minutes. This time reflects time of care of this signee Dr Jennet Maduro. This critical care time does not reflect procedure time, or teaching time or supervisory time of PA/NP/Med student/Med Resident etc but could involve care discussion time.  Rush Farmer, M.D. [facility] Pulmonary/Critical Care Medicine.

## 2019-04-22 DEATH — deceased

## 2021-10-01 IMAGING — DX DG CHEST 1V PORT
1 series · 1 of 1 positions shown · non-contrast
Comparison: April 06, 2019.

CLINICAL DATA: Hypoxia, [id number].

EXAM:
PORTABLE CHEST 1 VIEW

[chest ap]
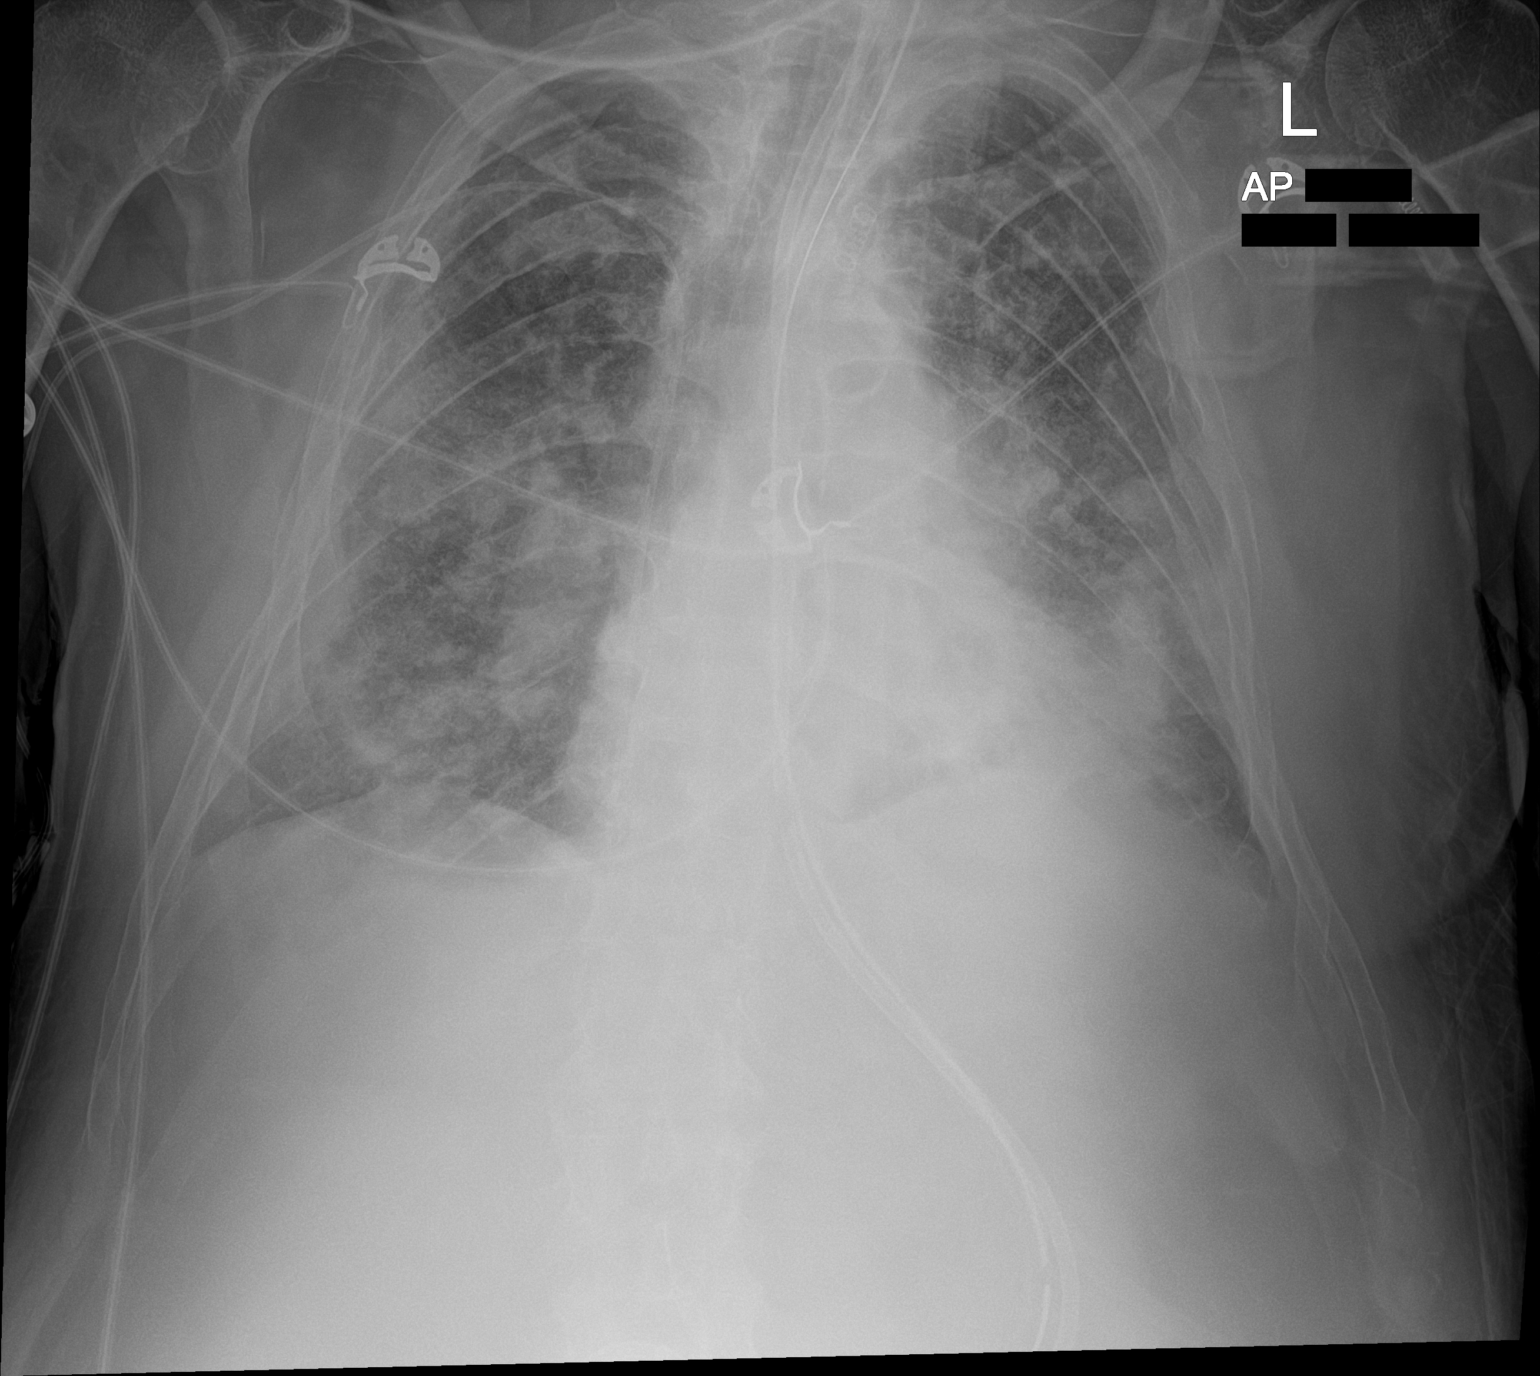

[1 of 1 positions shown; findings below may reference images not displayed]

FINDINGS: Stable cardiomediastinal silhouette. Endotracheal, feeding and
nasogastric tubes are unchanged in position. Right-sided PICC line
is unchanged. No pneumothorax or pleural effusion is noted. Stable
bilateral patchy airspace opacities are noted consistent multifocal
pneumonia. Bony thorax is unremarkable.
IMPRESSION: Stable support apparatus. Stable bilateral lung opacities consistent
with multifocal pneumonia.

## 2021-10-02 IMAGING — DX DG ABD PORTABLE 1V
1 series · 1 of 1 positions shown · non-contrast
Comparison: 04/05/2018

CLINICAL DATA: Distended abdomen

EXAM:
PORTABLE ABDOMEN - 1 VIEW

[abdomen kub]
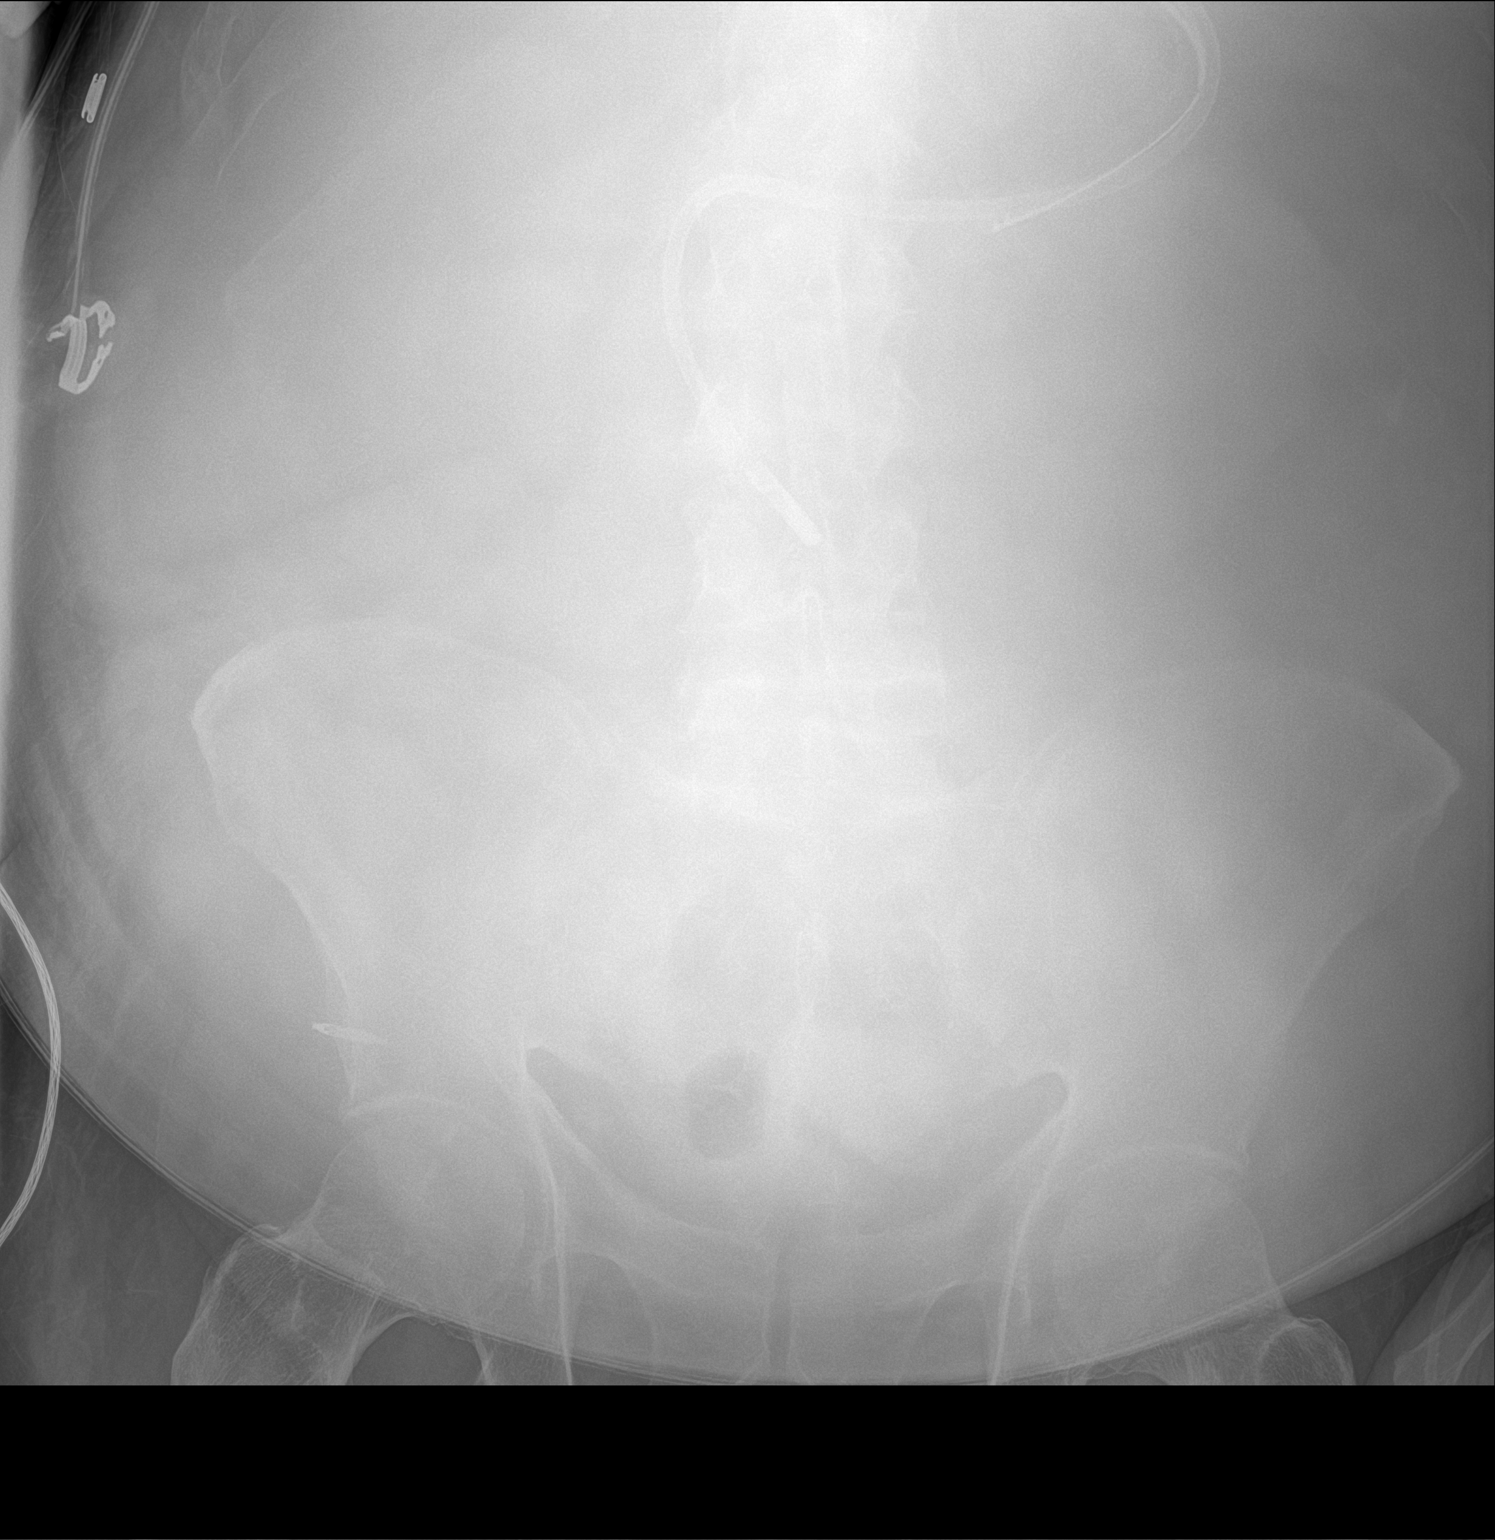

[1 of 1 positions shown; findings below may reference images not displayed]

FINDINGS: Limited evaluation due to technique and large patient size. Nearly
gasless abdomen. No ileus or obstruction

NG tube in the body of the stomach. Feeding tube in the second
portion duodenum.
IMPRESSION: Nearly gasless abdomen.  No dilated loops.

Feeding tube in the second portion the duodenum. NG tube in the body
the stomach.
